# Patient Record
Sex: Male | Born: 1985
Health system: Southern US, Community
[De-identification: ages and names within clinical notes are randomized; demographics above are authoritative.]

## PROBLEM LIST (undated history)

## (undated) DIAGNOSIS — T7840XA Allergy, unspecified, initial encounter: Secondary | ICD-10-CM

## (undated) DIAGNOSIS — R51 Headache: Secondary | ICD-10-CM

## (undated) DIAGNOSIS — R519 Headache, unspecified: Secondary | ICD-10-CM

## (undated) HISTORY — DX: Headache, unspecified: R51.9

## (undated) HISTORY — PX: SPINE SURGERY: SHX786

## (undated) HISTORY — PX: INGUINAL HERNIA REPAIR: SUR1180

## (undated) HISTORY — DX: Allergy, unspecified, initial encounter: T78.40XA

## (undated) HISTORY — PX: WISDOM TOOTH EXTRACTION: SHX21

## (undated) HISTORY — DX: Headache: R51

---

## 2012-02-17 DIAGNOSIS — H18609 Keratoconus, unspecified, unspecified eye: Secondary | ICD-10-CM | POA: Insufficient documentation

## 2016-01-01 DIAGNOSIS — L608 Other nail disorders: Secondary | ICD-10-CM | POA: Insufficient documentation

## 2016-11-07 ENCOUNTER — Telehealth: Payer: Self-pay | Admitting: Internal Medicine

## 2016-11-07 NOTE — Telephone Encounter (Signed)
Yes, I will see him.

## 2016-11-07 NOTE — Telephone Encounter (Signed)
Patient states his doctor is closing his practice. He needs a new PCP. A family member Noah Jones told him about you. He has not to anywhere in the [facility].   Do you accepted him as a patient?

## 2016-11-11 NOTE — Telephone Encounter (Signed)
LVM to inform patient to call office to set up new patient appointment.

## 2016-11-13 NOTE — Telephone Encounter (Signed)
Patient has an appointment on November 20, 2016 to est care.

## 2016-11-20 ENCOUNTER — Ambulatory Visit (INDEPENDENT_AMBULATORY_CARE_PROVIDER_SITE_OTHER): Payer: BLUE CROSS/BLUE SHIELD | Admitting: Internal Medicine

## 2016-11-20 ENCOUNTER — Other Ambulatory Visit (INDEPENDENT_AMBULATORY_CARE_PROVIDER_SITE_OTHER): Payer: BLUE CROSS/BLUE SHIELD

## 2016-11-20 ENCOUNTER — Encounter: Payer: Self-pay | Admitting: Internal Medicine

## 2016-11-20 ENCOUNTER — Ambulatory Visit (INDEPENDENT_AMBULATORY_CARE_PROVIDER_SITE_OTHER)
Admission: RE | Admit: 2016-11-20 | Discharge: 2016-11-20 | Disposition: A | Discharge status: Home / Self Care | Payer: BLUE CROSS/BLUE SHIELD | Source: Ambulatory Visit | Attending: Internal Medicine | Admitting: Internal Medicine

## 2016-11-20 VITALS — BP 140/84 | HR 72 | Temp 98.2°F | Resp 16 | Ht 69.45 in | Wt 189.2 lb

## 2016-11-20 DIAGNOSIS — Z0000 Encounter for general adult medical examination without abnormal findings: Secondary | ICD-10-CM

## 2016-11-20 DIAGNOSIS — R519 Headache, unspecified: Secondary | ICD-10-CM

## 2016-11-20 DIAGNOSIS — J0110 Acute frontal sinusitis, unspecified: Secondary | ICD-10-CM | POA: Diagnosis not present

## 2016-11-20 DIAGNOSIS — J301 Allergic rhinitis due to pollen: Secondary | ICD-10-CM

## 2016-11-20 DIAGNOSIS — R51 Headache: Secondary | ICD-10-CM

## 2016-11-20 LAB — COMPREHENSIVE METABOLIC PANEL
ALBUMIN: 4.8 g/dL (ref 3.5–5.2)
ALT: 29 U/L (ref 0–53)
AST: 15 U/L (ref 0–37)
Alkaline Phosphatase: 96 U/L (ref 39–117)
BUN: 21 mg/dL (ref 6–23)
CO2: 26 mEq/L (ref 19–32)
Calcium: 10 mg/dL (ref 8.4–10.5)
Chloride: 104 mEq/L (ref 96–112)
Creatinine, Ser: 0.89 mg/dL (ref 0.40–1.50)
GFR: 105.23 mL/min (ref >60.00)
GLUCOSE: 102 mg/dL — AB (ref 70–99)
POTASSIUM: 4.2 meq/L (ref 3.5–5.1)
Sodium: 138 mEq/L (ref 135–145)
TOTAL PROTEIN: 7.8 g/dL (ref 6.0–8.3)
Total Bilirubin: 0.5 mg/dL (ref 0.2–1.2)

## 2016-11-20 LAB — LIPID PANEL
Cholesterol: 171 mg/dL (ref 0–200)
HDL: 40.6 mg/dL (ref >39.00)
LDL Cholesterol: 99 mg/dL (ref 0–99)
NONHDL: 130.84
Total CHOL/HDL Ratio: 4
Triglycerides: 157 mg/dL — ABNORMAL HIGH (ref 0.0–149.0)
VLDL: 31.4 mg/dL (ref 0.0–40.0)

## 2016-11-20 LAB — CBC WITH DIFFERENTIAL/PLATELET
BASOS ABS: 0.1 10*3/uL (ref 0.0–0.1)
BASOS PCT: 1 % (ref 0.0–3.0)
EOS ABS: 0.1 10*3/uL (ref 0.0–0.7)
Eosinophils Relative: 1.5 % (ref 0.0–5.0)
HEMATOCRIT: 45.6 % (ref 39.0–52.0)
Hemoglobin: 16 g/dL (ref 13.0–17.0)
LYMPHS PCT: 31.1 % (ref 12.0–46.0)
Lymphs Abs: 1.9 10*3/uL (ref 0.7–4.0)
MCHC: 35.2 g/dL (ref 30.0–36.0)
MCV: 89.5 fl (ref 78.0–100.0)
Monocytes Absolute: 0.4 10*3/uL (ref 0.1–1.0)
Monocytes Relative: 7.1 % (ref 3.0–12.0)
NEUTROS ABS: 3.6 10*3/uL (ref 1.4–7.7)
NEUTROS PCT: 59.3 % (ref 43.0–77.0)
PLATELETS: 199 10*3/uL (ref 150.0–400.0)
RBC: 5.1 Mil/uL (ref 4.22–5.81)
RDW: 12.5 % (ref 11.5–15.5)
WBC: 6.1 10*3/uL (ref 4.0–10.5)

## 2016-11-20 LAB — TSH: TSH: 0.92 u[IU]/mL (ref 0.35–4.50)

## 2016-11-20 LAB — SEDIMENTATION RATE: Sed Rate: 10 mm/hr (ref 0–15)

## 2016-11-20 MED ORDER — METHYLPREDNISOLONE 4 MG PO TBPK: ORAL_TABLET | ORAL | 0 refills | Status: DC

## 2016-11-20 MED ORDER — AMOXICILLIN-POT CLAVULANATE 875-125 MG PO TABS: 1.0000 | ORAL_TABLET | Freq: Two times a day (BID) | ORAL | 0 refills | Status: AC

## 2016-11-20 NOTE — Patient Instructions (Signed)

## 2016-11-20 NOTE — Progress Notes (Signed)
Subjective:  Patient ID: Noah Jones, male    DOB: 1986-01-05  Age: 31 y.o. MRN: [id number]  CC: Headache; Annual Exam; and Sinusitis  New to me  HPI Noah Jones presents for a CPX.  He complains that he developed cold symptoms a month ago and has had a persistent cough for 3 weeks that was productive of mucus. He tells me the cough resolved a week ago but now he is dealing with persistent left headache pain above his left eye. He describes it as a sharp, throbbing sensation that is exacerbated by bending forward, sneezing and coughing. He has gotten some symptom relief with Motrin and Allegra. He has had a few episodes of nausea but no vomiting. He has chronic numbness in his right lower extremity from a prior lumbar surgery. He also complains of nasal congestion, sneezing, runny nose, phlegm from his nose and postnasal drip. He denies sore throat, lymphadenopathy, night sweats, fever, or chills. He has chronic rash from eczema and seborrheic dermatitis.  History Noah Jones has a past medical history of Headache.   He has a past surgical history that includes Wisdom tooth extraction.   His family history includes Cancer - Lung in his other; Dementia in his paternal grandmother; Hypercholesterolemia in his paternal uncle; Hypertension in his father and mother; Stroke in his maternal grandfather.He reports that he has never smoked. He has never used smokeless tobacco. He reports that he drinks about 0.6 oz of alcohol per week . He reports that he does not use drugs.  No outpatient prescriptions prior to visit.   No facility-administered medications prior to visit.     ROS Review of Systems  Constitutional: Negative.  Negative for activity change, chills, diaphoresis, fatigue and fever.  HENT: Positive for congestion, postnasal drip, rhinorrhea, sinus pain, sinus pressure and sneezing. Negative for ear discharge, ear pain, facial swelling, sore throat, tinnitus, trouble swallowing and  voice change.   Eyes: Negative.   Respiratory: Negative.  Negative for cough, choking, chest tightness, shortness of breath, wheezing and stridor.   Cardiovascular: Negative for chest pain, palpitations and leg swelling.  Gastrointestinal: Positive for nausea. Negative for abdominal pain, constipation and diarrhea.  Allergic/Immunologic: Negative.   Neurological: Positive for headaches. Negative for dizziness, tremors, seizures, facial asymmetry, weakness, light-headedness and numbness.  Hematological: Negative for adenopathy. Does not bruise/bleed easily.  Psychiatric/Behavioral: Negative.     Objective:  BP 140/84 (BP Location: Left Arm, Patient Position: Sitting, Cuff Size: Normal)   Pulse 72   Temp 98.2 F (36.8 C) (Oral)   Ht 5' 9.45" (1.764 m)   Wt 189 lb 4 oz (85.8 kg)   SpO2 99%   BMI 27.59 kg/m   Physical Exam  Constitutional: He is oriented to person, place, and time.  Non-toxic appearance. He does not have a sickly appearance. He does not appear ill. No distress.  HENT:  Right Ear: Hearing, tympanic membrane, external ear and ear canal normal.  Left Ear: Hearing, tympanic membrane, external ear and ear canal normal.  Nose: Mucosal edema present. No rhinorrhea. No epistaxis. Right sinus exhibits no maxillary sinus tenderness and no frontal sinus tenderness. Left sinus exhibits frontal sinus tenderness. Left sinus exhibits no maxillary sinus tenderness.  Mouth/Throat: Mucous membranes are normal. Mucous membranes are not pale, not dry and not cyanotic. No oral lesions. No trismus in the jaw. No uvula swelling. No oropharyngeal exudate, posterior oropharyngeal erythema or tonsillar abscesses.  Left nasopharynx is swollen with a scant amount of clear exudate  Eyes: Conjunctivae and EOM are normal. Pupils are equal, round, and reactive to light. Right eye exhibits no discharge. Left eye exhibits no discharge. No scleral icterus.  Neck: Normal range of motion. Neck supple. No JVD  present. No thyromegaly present.  Cardiovascular: Normal rate, regular rhythm and intact distal pulses.  Exam reveals no gallop and no friction rub.   No murmur heard. Pulmonary/Chest: Effort normal and breath sounds normal. No respiratory distress. He has no wheezes. He has no rales. He exhibits no tenderness.  Abdominal: Soft. Bowel sounds are normal. He exhibits no mass. There is no tenderness.  Musculoskeletal: Normal range of motion. He exhibits no edema or tenderness.  Lymphadenopathy:    He has no cervical adenopathy.  Neurological: He is alert and oriented to person, place, and time. He has normal reflexes. He displays normal reflexes. No cranial nerve deficit. He exhibits normal muscle tone. Coordination normal.  Skin: Skin is warm and dry. No rash noted. He is not diaphoretic. No erythema. No pallor.  Psychiatric: He has a normal mood and affect. His behavior is normal. Judgment and thought content normal.  Vitals reviewed.     Assessment & Plan:   Noah Jones was seen today for headache, annual exam and sinusitis.  Diagnoses and all orders for this visit:  Routine general medical examination at a health care facility- exam completed, labs ordered, vaccines reviewed, patient education material was given. -     Lipid panel; Future  Acute non-recurrent frontal sinusitis- I've ordered a CT scan of the brain and sinuses to see if there is been extension into the CNS. Will treat empirically with Augmentin. -     CT HEAD WO CONTRAST; Future -     CT MAXILLOFACIAL WO CONTRAST; Future -     amoxicillin-clavulanate (AUGMENTIN) 875-125 MG tablet; Take 1 tablet by mouth 2 (two) times daily.  Intractable episodic headache, unspecified headache type- I will check his labs to see if there is concern for vasculitis, systemic infection, thyroid disease, renal disease. He does have symptoms of a sinus infection so will scan his brain to see if there has been extension into the CNS, he will  continue ibuprofen as needed for symptom relief. -     Comprehensive metabolic panel; Future -     CBC with Differential/Platelet; Future -     Sedimentation rate; Future -     TSH; Future -     CT HEAD WO CONTRAST; Future -     CT MAXILLOFACIAL WO CONTRAST; Future  Seasonal allergic rhinitis due to pollen- he is having a flareup of symptoms despite taking Allegra so will try a course of systemic steroids. -     methylPREDNISolone (MEDROL DOSEPAK) 4 MG TBPK tablet; TAKE AS DIRECTED   I am having Noah Jones start on amoxicillin-clavulanate and methylPREDNISolone. I am also having him maintain his fexofenadine and ibuprofen.  Meds ordered this encounter  Medications  . fexofenadine (ALLEGRA) 30 MG tablet    Sig: Take 30 mg by mouth 2 (two) times daily.  Marland Kitchen. ibuprofen (ADVIL,MOTRIN) 200 MG tablet    Sig: Take 200 mg by mouth 4 (four) times daily.  Marland Kitchen. amoxicillin-clavulanate (AUGMENTIN) 875-125 MG tablet    Sig: Take 1 tablet by mouth 2 (two) times daily.    Dispense:  20 tablet    Refill:  0  . methylPREDNISolone (MEDROL DOSEPAK) 4 MG TBPK tablet    Sig: TAKE AS DIRECTED    Dispense:  21 tablet  Refill:  0     Follow-up: Return in about 1 week (around 11/27/2016).  Sanda Linger, MD

## 2017-07-16 ENCOUNTER — Encounter: Payer: Self-pay | Admitting: Internal Medicine

## 2017-07-16 ENCOUNTER — Ambulatory Visit: Payer: PRIVATE HEALTH INSURANCE | Admitting: Internal Medicine

## 2017-07-16 VITALS — BP 130/78 | HR 95 | Temp 98.3°F | Resp 16 | Ht 69.45 in | Wt 194.5 lb

## 2017-07-16 DIAGNOSIS — F5101 Primary insomnia: Secondary | ICD-10-CM | POA: Diagnosis not present

## 2017-07-16 DIAGNOSIS — K4020 Bilateral inguinal hernia, without obstruction or gangrene, not specified as recurrent: Secondary | ICD-10-CM

## 2017-07-16 MED ORDER — SUVOREXANT 15 MG PO TABS
1.0000 | ORAL_TABLET | Freq: Every evening | ORAL | 5 refills | Status: DC | PRN
Start: 2017-07-16 — End: 2018-12-01

## 2017-07-16 NOTE — Patient Instructions (Signed)

## 2017-07-17 ENCOUNTER — Encounter: Payer: Self-pay | Admitting: Internal Medicine

## 2017-07-17 NOTE — Progress Notes (Signed)
Subjective:  Patient ID: Noah RoachMatthew Jones, male    DOB: 1986-03-07  Age: 32 y.o. MRN: [id number]  CC: Groin Pain   HPI Noah RoachMatthew Rueda presents for concerns about a 3-week history of groin pain.  He describes a groin pain that migrates from the left side to the right side.  The left side occasionally radiates into the scrotum.  He describes the pain as a tenderness.  He has not noticed any bulging.  He also complains that he is started working the third shift.  He tells me this has disturbed his sleep pattern and he has difficulty falling and staying asleep.  Outpatient Medications Prior to Visit  Medication Sig Dispense Refill  . fexofenadine (ALLEGRA) 30 MG tablet Take 30 mg by mouth 2 (two) times daily.    Marland Kitchen. ibuprofen (ADVIL,MOTRIN) 200 MG tablet Take 200 mg by mouth 4 (four) times daily.    . methylPREDNISolone (MEDROL DOSEPAK) 4 MG TBPK tablet TAKE AS DIRECTED 21 tablet 0   No facility-administered medications prior to visit.     ROS Review of Systems  Constitutional: Negative for chills, fatigue and fever.  HENT: Negative.   Eyes: Negative.   Respiratory: Negative for cough, chest tightness, shortness of breath and wheezing.   Cardiovascular: Negative for chest pain, palpitations and leg swelling.  Gastrointestinal: Positive for abdominal pain. Negative for constipation, diarrhea, nausea and vomiting.  Endocrine: Negative.   Genitourinary: Negative.  Negative for decreased urine volume, difficulty urinating, discharge, dysuria, flank pain, frequency, penile pain, penile swelling, scrotal swelling, testicular pain and urgency.  Musculoskeletal: Negative.   Skin: Negative.  Negative for rash.  Neurological: Negative.  Negative for dizziness.  Hematological: Negative for adenopathy. Does not bruise/bleed easily.  Psychiatric/Behavioral: Positive for sleep disturbance. Negative for decreased concentration, dysphoric mood and suicidal ideas. The patient is not nervous/anxious.      Objective:  BP 130/78 (BP Location: Left Arm, Patient Position: Sitting, Cuff Size: Normal)   Pulse 95   Temp 98.3 F (36.8 C) (Oral)   Resp 16   Ht 5' 9.45" (1.764 m)   Wt 194 lb 8 oz (88.2 kg)   SpO2 99%   BMI 28.35 kg/m   BP Readings from Last 3 Encounters:  07/16/17 130/78  11/20/16 140/84    Wt Readings from Last 3 Encounters:  07/16/17 194 lb 8 oz (88.2 kg)  11/20/16 189 lb 4 oz (85.8 kg)    Physical Exam  Constitutional: He is oriented to person, place, and time. No distress.  HENT:  Mouth/Throat: Oropharynx is clear and moist. No oropharyngeal exudate.  Eyes: Conjunctivae are normal. Left eye exhibits no discharge. No scleral icterus.  Neck: Normal range of motion. Neck supple. No JVD present. No thyromegaly present.  Cardiovascular: Normal rate, regular rhythm and normal heart sounds. Exam reveals no gallop.  No murmur heard. Pulmonary/Chest: Effort normal and breath sounds normal. No respiratory distress. He has no wheezes. He has no rales.  Abdominal: Soft. Bowel sounds are normal. He exhibits no distension and no mass. There is no tenderness. A hernia is present. Hernia confirmed positive in the right inguinal area and confirmed positive in the left inguinal area.  Genitourinary: Penis normal. Right testis shows no mass, no swelling and no tenderness. Right testis is descended. Left testis shows no mass, no swelling and no tenderness. Left testis is descended. Circumcised. No penile erythema or penile tenderness. No discharge found.  Genitourinary Comments: There are bilateral, direct inguinal hernias that are only palpated with  Valsalva maneuver.  Musculoskeletal: Normal range of motion. He exhibits no edema or tenderness.  Lymphadenopathy:    He has no cervical adenopathy.       Right: No inguinal adenopathy present.       Left: No inguinal adenopathy present.  Neurological: He is alert and oriented to person, place, and time.  Skin: Skin is warm and dry.  No rash noted. He is not diaphoretic. No erythema.  Psychiatric: He has a normal mood and affect. His behavior is normal. Judgment and thought content normal.  Vitals reviewed.   Lab Results  Component Value Date   WBC 6.1 11/20/2016   HGB 16.0 11/20/2016   HCT 45.6 11/20/2016   PLT 199.0 11/20/2016   GLUCOSE 102 (H) 11/20/2016   CHOL 171 11/20/2016   TRIG 157.0 (H) 11/20/2016   HDL 40.60 11/20/2016   LDLCALC 99 11/20/2016   ALT 29 11/20/2016   AST 15 11/20/2016   NA 138 11/20/2016   K 4.2 11/20/2016   CL 104 11/20/2016   CREATININE 0.89 11/20/2016   BUN 21 11/20/2016   CO2 26 11/20/2016   TSH 0.92 11/20/2016    Ct Head Wo Contrast  Result Date: 11/20/2016 CLINICAL DATA:  Headaches, primarily left temporal region EXAM: CT HEAD WITHOUT CONTRAST TECHNIQUE: Contiguous axial images were obtained from the base of the skull through the vertex without intravenous contrast. COMPARISON:  None. FINDINGS: Brain: The ventricles are normal in size and configuration. There is no intracranial mass, hemorrhage, extra-axial fluid collection, or midline shift. Gray-white compartments appear normal. No evident acute infarct. Vascular: No hyperdense vessel. There is no appreciable vascular calcification. Skull:  The bony calvarium appears intact. Sinuses/Orbits: Visualized paranasal sinuses are clear. Visualized orbits appear symmetric bilaterally. Other:  Visualized mastoid air cells are clear. IMPRESSION: Study within normal limits. Electronically Signed   By: Bretta Bang III M.D.   On: 11/20/2016 11:57   Ct Maxillofacial Ltd Wo Cm  Result Date: 11/20/2016 CLINICAL DATA:  Left temporal headaches with sinus congestion left-sided EXAM: CT PARANASAL SINUS LIMITED WITHOUT CONTRAST TECHNIQUE: Non-contiguous multidetector CT images of the paranasal sinuses were obtained in a coronal plane without contrast. COMPARISON:  Head CT November 20, 2016 FINDINGS: There is a retention cyst in the inferior right  maxillary antrum measuring 1.8 x 1.4 cm. There is mucosal thickening in the inferior left maxillary antrum with apparent retention cyst measuring 1.1 x 0.4 cm. Visualized paranasal sinuses elsewhere are clear. No air-fluid level. No bony destruction or expansion evident. Ostiomeatal complex regions are felt to be patent although less than optimally visualized. There is rightward deviation of the nasal septum. There is narrowing of the right naris due to the nasal septal deviation without frank nares obstruction. Nasal turbinates do not appear appreciably edematous. Surrounding soft tissues appear normal. IMPRESSION: Retention cysts in each inferior maxillary antrum, larger on the right. Mild mucosal thickening inferior left maxillary antrum. Other visualized paranasal sinuses clear. No appreciable ostiomeatal unit complex obstruction. No bony destruction or expansion. No air-fluid levels. Rightward deviation of the nasal septum with narrowing of the right naris. No frank nares obstruction. Electronically Signed   By: Bretta Bang III M.D.   On: 11/20/2016 12:01    Assessment & Plan:   Percival was seen today for groin pain.  Diagnoses and all orders for this visit:  Bilateral inguinal hernia without obstruction or gangrene, recurrence not specified -     Ambulatory referral to General Surgery  Persistent insomnia of non-organic origin -  Suvorexant (BELSOMRA) 15 MG TABS; Take 1 tablet by mouth at bedtime as needed.   I have discontinued Demontay Pember's methylPREDNISolone. I am also having him start on Suvorexant. Additionally, I am having him maintain his fexofenadine and ibuprofen.  Meds ordered this encounter  Medications  . Suvorexant (BELSOMRA) 15 MG TABS    Sig: Take 1 tablet by mouth at bedtime as needed.    Dispense:  30 tablet    Refill:  5     Follow-up: Return if symptoms worsen or fail to improve.  Sanda Linger, MD

## 2017-08-06 ENCOUNTER — Ambulatory Visit: Payer: Self-pay | Admitting: Surgery

## 2017-08-06 NOTE — H&P (Signed)
Noah RoachMatthew Jones Documented: 08/06/2017 4:08 PM Location: [facility] Patient #: [id number] DOB: 03/21/1986 Single / Language: [place] / Race: White Male  History of Present Illness Noah Jones(Noah Bingley C. Fredia Chittenden MD; 08/06/2017 5:26 PM) The patient is a 32 year old male who presents with an inguinal hernia. Note for "Inguinal hernia": ` ` ` Patient sent for surgical consultation at the request of Dr. Sanda Lingerhomas Jones  Chief Complaint: Bilateral inguinal hernias  The patient is a pleasant active male that noted some groin soreness starting about 2 months ago. Initially left-sided. Would radiate testicle. Then right sided. Then sometimes just testicular. It. Was concerned. Saw primary care physician. Concern for bilateral inguinal hernias. Surgical consultation requested. Patient's had no prior surgery. He does not smoke. No problems with bowel movements. Usually 1 or 2 a day. No straining to urinate or urinary infections. He can walk several miles without difficulty. He works graveyard shift doing machine work. Otherwise healthy.  (Review of systems as stated in this history (HPI) or in the review of systems. Otherwise all other 12 point ROS are negative)   Past Surgical History Noah Jones(Noah Jones, CMA; 08/06/2017 4:08 PM) Spinal Surgery - Lower Back  Diagnostic Studies History Noah Jones(Noah Jones, CMA; 08/06/2017 4:08 PM) Colonoscopy never  Allergies Noah Jones(Noah Jones, CMA; 08/06/2017 4:09 PM) No Known Drug Allergies [08/06/2017]:  Medication History (Noah Jones, CMA; 08/06/2017 4:10 PM) Belsomra (15MG  Tablet, Oral) Active. Medications Reconciled  Social History Noah Jones(Noah Jones, CMA; 08/06/2017 4:08 PM) Caffeine use Tea. No drug use Tobacco use Never smoker.  Family History Noah Jones(Noah Jones, CMA; 08/06/2017 4:08 PM) Arthritis Family Members In General. Hypertension Family Members In General, Mother.  Other Problems Noah Jones(Noah Jones, CMA; 08/06/2017 4:08 PM) Anxiety  Disorder Back Pain Hemorrhoids Other disease, cancer, significant illness     Review of Systems (Noah Jones CMA; 08/06/2017 4:08 PM) General Present- Weight Loss. Not Present- Appetite Loss, Chills, Fatigue, Fever, Night Sweats and Weight Gain. Skin Present- Dryness. Not Present- Change in Wart/Mole, Hives, Jaundice, New Lesions, Non-Healing Wounds, Rash and Ulcer. HEENT Present- Seasonal Allergies and Wears glasses/contact lenses. Not Present- Earache, Hearing Loss, Hoarseness, Nose Bleed, Oral Ulcers, Ringing in the Ears, Sinus Pain, Sore Throat, Visual Disturbances and Yellow Eyes. Breast Not Present- Breast Mass, Breast Pain, Nipple Discharge and Skin Changes. Cardiovascular Not Present- Chest Pain, Difficulty Breathing Lying Down, Leg Cramps, Palpitations, Rapid Heart Rate, Shortness of Breath and Swelling of Extremities. Gastrointestinal Present- Abdominal Pain. Not Present- Bloating, Bloody Stool, Change in Bowel Habits, Chronic diarrhea, Constipation, Difficulty Swallowing, Excessive gas, Gets full quickly at meals, Hemorrhoids, Indigestion, Nausea, Rectal Pain and Vomiting. Male Genitourinary Not Present- Blood in Urine, Change in Urinary Stream, Frequency, Impotence, Nocturia, Painful Urination, Urgency and Urine Leakage. Musculoskeletal Present- Joint Stiffness. Not Present- Back Pain, Joint Pain, Muscle Pain, Muscle Weakness and Swelling of Extremities. Neurological Present- Numbness. Not Present- Decreased Memory, Fainting, Headaches, Seizures, Tingling, Tremor, Trouble walking and Weakness. Psychiatric Not Present- Anxiety, Bipolar, Change in Sleep Pattern, Depression, Fearful and Frequent crying. Endocrine Not Present- Cold Intolerance, Excessive Hunger, Hair Changes, Heat Intolerance, Hot flashes and New Diabetes. Hematology Not Present- Blood Thinners, Easy Bruising, Excessive bleeding, Gland problems, HIV and Persistent Infections.  Vitals (Noah Jones CMA; 08/06/2017  4:09 PM) 08/06/2017 4:09 PM Weight: 195 lb Height: 69in Body Surface Area: 2.04 m Body Mass Index: 28.8 kg/m  Pulse: 88 (Regular)  BP: 124/70 (Sitting, Left Arm, Standard)      Physical Exam Noah Jones(Noah Jones C. Ailani Governale MD; 08/06/2017 5:26 PM)  General Mental Status-Alert.  General Appearance-Not in acute distress, Not Sickly. Orientation-Oriented X3. Hydration-Well hydrated. Voice-Normal.  Integumentary Global Assessment Upon inspection and palpation of skin surfaces of the - Axillae: non-tender, no inflammation or ulceration, no drainage. and Distribution of scalp and body hair is normal. General Characteristics Temperature - normal warmth is noted.  Head and Neck Head-normocephalic, atraumatic with no lesions or palpable masses. Face Global Assessment - atraumatic, no absence of expression. Neck Global Assessment - no abnormal movements, no bruit auscultated on the right, no bruit auscultated on the left, no decreased range of motion, non-tender. Trachea-midline. Thyroid Gland Characteristics - non-tender.  Eye Eyeball - Left-Extraocular movements intact, No Nystagmus. Eyeball - Right-Extraocular movements intact, No Nystagmus. Cornea - Left-No Hazy. Cornea - Right-No Hazy. Sclera/Conjunctiva - Left-No scleral icterus, No Discharge. Sclera/Conjunctiva - Right-No scleral icterus, No Discharge. Pupil - Left-Direct reaction to light normal. Pupil - Right-Direct reaction to light normal.  ENMT Ears Pinna - Left - no drainage observed, no generalized tenderness observed. Right - no drainage observed, no generalized tenderness observed. Nose and Sinuses External Inspection of the Nose - no destructive lesion observed. Inspection of the nares - Left - quiet respiration. Right - quiet respiration. Mouth and Throat Lips - Upper Lip - no fissures observed, no pallor noted. Lower Lip - no fissures observed, no pallor noted. Nasopharynx - no  discharge present. Oral Cavity/Oropharynx - Tongue - no dryness observed. Oral Mucosa - no cyanosis observed. Hypopharynx - no evidence of airway distress observed.  Chest and Lung Exam Inspection Movements - Normal and Symmetrical. Accessory muscles - No use of accessory muscles in breathing. Palpation Palpation of the chest reveals - Non-tender. Auscultation Breath sounds - Normal and Clear.  Cardiovascular Auscultation Rhythm - Regular. Murmurs & Other Heart Sounds - Auscultation of the heart reveals - No Murmurs and No Systolic Clicks.  Abdomen Inspection Inspection of the abdomen reveals - No Visible peristalsis and No Abnormal pulsations. Umbilicus - No Bleeding, No Urine drainage. Palpation/Percussion Palpation and Percussion of the abdomen reveal - Soft, Non Tender, No Rebound tenderness, No Rigidity (guarding) and No Cutaneous hyperesthesia. Note: Abdomen soft. Nontender. Not distended. No umbilical or incisional hernias. No guarding.  Male Genitourinary Sexual Maturity Tanner 5 - Adult hair pattern and Adult penile size and shape. Note: Sensitive external rings with impulse on Valsalva consistent with bilateral inguinal hernias. Otherwise normal external male genitalia. Epididymides testes and spermatic cords normal. Sensitive but not severely painful  Peripheral Vascular Upper Extremity Inspection - Left - No Cyanotic nailbeds, Not Ischemic. Right - No Cyanotic nailbeds, Not Ischemic.  Neurologic Neurologic evaluation reveals -normal attention span and ability to concentrate, able to name objects and repeat phrases. Appropriate fund of knowledge , normal sensation and normal coordination. Mental Status Affect - not angry, not paranoid. Cranial Nerves-Normal Bilaterally. Gait-Normal.  Neuropsychiatric Mental status exam performed with findings of-able to articulate well with normal speech/language, rate, volume and coherence, thought content normal  with ability to perform basic computations and apply abstract reasoning and no evidence of hallucinations, delusions, obsessions or homicidal/suicidal ideation.  Musculoskeletal Global Assessment Spine, Ribs and Pelvis - no instability, subluxation or laxity. Right Upper Extremity - no instability, subluxation or laxity.  Lymphatic Head & Neck  General Head & Neck Lymphatics: Bilateral - Description - No Localized lymphadenopathy. Axillary  General Axillary Region: Bilateral - Description - No Localized lymphadenopathy. Femoral & Inguinal  Generalized Femoral & Inguinal Lymphatics: Left - Description - No Localized lymphadenopathy. Right - Description - No Localized lymphadenopathy.  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  I think he would benefit from surgical repair. He is interested in proceeding. We will work towards a convenient time   PREOP - ING HERNIA - ENCOUNTER FOR PREOPERATIVE EXAMINATION FOR GENERAL SURGICAL PROCEDURE (Z01.818)  Current Plans You are being scheduled for surgery- Our schedulers will call you.  You should hear from our office's scheduling department within 5 working days about the location, date, and time of surgery. We try to make accommodations for patient's preferences in scheduling surgery, but sometimes the OR schedule or the surgeon's schedule prevents [place] from making those accommodations.  If you have not heard from our office [phone]) in 5 working days, call the office and ask for your surgeon's nurse.  If you have other questions about your diagnosis, plan, or surgery, call the office and ask for your surgeon's nurse.  Written instructions provided The anatomy & physiology of the abdominal wall and pelvic floor was discussed. The  pathophysiology of hernias in the inguinal and pelvic region was discussed. Natural history risks such as progressive enlargement, pain, incarceration, and strangulation was discussed. Contributors to complications such as smoking, obesity, diabetes, prior surgery, etc were discussed.  I feel the risks of no intervention will lead to serious problems that outweigh the operative risks; therefore, I recommended surgery to reduce and repair the hernia. I explained laparoscopic techniques with possible need for an open approach. I noted usual use of mesh to patch and/or buttress hernia repair  Risks such as bleeding, infection, abscess, need for further treatment, heart attack, death, and other risks were discussed. I noted a good likelihood this will help address the problem. Goals of post-operative recovery were discussed as well. Possibility that this will not correct all symptoms was explained. I stressed the importance of low-impact activity, aggressive pain control, avoiding constipation, & not pushing through pain to minimize risk of post-operative chronic pain or injury. Possibility of reherniation was discussed. We will work to minimize complications.  An educational handout further explaining the pathology & treatment options was given as well. Questions were answered. The patient expresses understanding & wishes to proceed with surgery.  Pt Education - Pamphlet Given - Laparoscopic Hernia Repair: discussed with patient and provided information. Pt Education - [facility] Pain Control (Anitha Kreiser) Pt Education - [facility] Hernia Post-Op HCI (Felicitas Sine): discussed with patient and provided information. Pt Education - [facility] Mesh education: discussed with patient and provided information.  Noah Jones, M.D., F.A.C.S. Gastrointestinal and Minimally Invasive Surgery [facility] [address], [address] [place], [place] [address] [phone] Main / Paging

## 2017-10-13 ENCOUNTER — Ambulatory Visit (INDEPENDENT_AMBULATORY_CARE_PROVIDER_SITE_OTHER): Payer: Managed Care, Other (non HMO)

## 2017-10-13 DIAGNOSIS — Z23 Encounter for immunization: Secondary | ICD-10-CM

## 2017-10-13 DIAGNOSIS — Z299 Encounter for prophylactic measures, unspecified: Secondary | ICD-10-CM

## 2017-11-27 ENCOUNTER — Encounter: Payer: Self-pay | Admitting: Internal Medicine

## 2017-11-27 ENCOUNTER — Ambulatory Visit (INDEPENDENT_AMBULATORY_CARE_PROVIDER_SITE_OTHER): Payer: Managed Care, Other (non HMO) | Admitting: Internal Medicine

## 2017-11-27 ENCOUNTER — Other Ambulatory Visit (INDEPENDENT_AMBULATORY_CARE_PROVIDER_SITE_OTHER): Payer: Managed Care, Other (non HMO)

## 2017-11-27 VITALS — BP 144/102 | HR 76 | Temp 97.8°F | Resp 16 | Ht 69.45 in | Wt 196.0 lb

## 2017-11-27 DIAGNOSIS — I10 Essential (primary) hypertension: Secondary | ICD-10-CM

## 2017-11-27 DIAGNOSIS — Z0000 Encounter for general adult medical examination without abnormal findings: Secondary | ICD-10-CM

## 2017-11-27 LAB — URINALYSIS, ROUTINE W REFLEX MICROSCOPIC
Bilirubin Urine: NEGATIVE
HGB URINE DIPSTICK: NEGATIVE
KETONES UR: NEGATIVE
Leukocytes, UA: NEGATIVE
NITRITE: NEGATIVE
RBC / HPF: NONE SEEN (ref 0–?)
Specific Gravity, Urine: 1.02 (ref 1.000–1.030)
Total Protein, Urine: NEGATIVE
UROBILINOGEN UA: 0.2 (ref 0.0–1.0)
Urine Glucose: NEGATIVE
pH: 6 (ref 5.0–8.0)

## 2017-11-27 LAB — CBC WITH DIFFERENTIAL/PLATELET
BASOS ABS: 0.1 10*3/uL (ref 0.0–0.1)
Basophils Relative: 0.8 % (ref 0.0–3.0)
Eosinophils Absolute: 0.1 10*3/uL (ref 0.0–0.7)
Eosinophils Relative: 2 % (ref 0.0–5.0)
HCT: 47.8 % (ref 39.0–52.0)
HEMOGLOBIN: 16.2 g/dL (ref 13.0–17.0)
LYMPHS ABS: 2 10*3/uL (ref 0.7–4.0)
Lymphocytes Relative: 32.6 % (ref 12.0–46.0)
MCHC: 34 g/dL (ref 30.0–36.0)
MCV: 91 fl (ref 78.0–100.0)
MONO ABS: 0.6 10*3/uL (ref 0.1–1.0)
MONOS PCT: 8.9 % (ref 3.0–12.0)
NEUTROS PCT: 55.7 % (ref 43.0–77.0)
Neutro Abs: 3.4 10*3/uL (ref 1.4–7.7)
Platelets: 206 10*3/uL (ref 150.0–400.0)
RBC: 5.25 Mil/uL (ref 4.22–5.81)
RDW: 12.8 % (ref 11.5–15.5)
WBC: 6.2 10*3/uL (ref 4.0–10.5)

## 2017-11-27 LAB — COMPREHENSIVE METABOLIC PANEL
ALT: 37 U/L (ref 0–53)
AST: 19 U/L (ref 0–37)
Albumin: 4.7 g/dL (ref 3.5–5.2)
Alkaline Phosphatase: 112 U/L (ref 39–117)
BUN: 19 mg/dL (ref 6–23)
CALCIUM: 9.7 mg/dL (ref 8.4–10.5)
CHLORIDE: 103 meq/L (ref 96–112)
CO2: 28 meq/L (ref 19–32)
CREATININE: 0.83 mg/dL (ref 0.40–1.50)
GFR: 113.34 mL/min (ref >60.00)
GLUCOSE: 97 mg/dL (ref 70–99)
Potassium: 4.1 mEq/L (ref 3.5–5.1)
SODIUM: 138 meq/L (ref 135–145)
Total Bilirubin: 0.6 mg/dL (ref 0.2–1.2)
Total Protein: 7.8 g/dL (ref 6.0–8.3)

## 2017-11-27 LAB — LIPID PANEL
CHOL/HDL RATIO: 5
Cholesterol: 189 mg/dL (ref 0–200)
HDL: 38.5 mg/dL — AB (ref >39.00)
LDL Cholesterol: 117 mg/dL — ABNORMAL HIGH (ref 0–99)
NONHDL: 150.69
Triglycerides: 166 mg/dL — ABNORMAL HIGH (ref 0.0–149.0)
VLDL: 33.2 mg/dL (ref 0.0–40.0)

## 2017-11-27 MED ORDER — AZILSARTAN MEDOXOMIL 40 MG PO TABS: 1.0000 | ORAL_TABLET | Freq: Every day | ORAL | 0 refills | Status: DC

## 2017-11-27 NOTE — Progress Notes (Signed)
Subjective:  Patient ID: Noah Jones, male    DOB: June 29, 1985  Age: 32 y.o. MRN: [id number]  CC: Annual Exam and Hypertension   HPI Noah Jones presents for a CPX.  He continues to struggle with insomnia related to working the third shift.  He gets adequate symptom relief with Belsomra.  He denies snoring or sleep apnea.  He has a strong family history of hypertension.  He denies any recent episodes of chest pain, shortness of breath, palpitations, edema, or fatigue.  He occasionally takes ibuprofen for musculoskeletal pain.  Outpatient Medications Prior to Visit  Medication Sig Dispense Refill  . fexofenadine (ALLEGRA) 30 MG tablet Take 30 mg by mouth 2 (two) times daily.    . Suvorexant (BELSOMRA) 15 MG TABS Take 1 tablet by mouth at bedtime as needed. 30 tablet 5  . ibuprofen (ADVIL,MOTRIN) 200 MG tablet Take 200 mg by mouth 4 (four) times daily.     No facility-administered medications prior to visit.     ROS Review of Systems  Constitutional: Negative.  Negative for diaphoresis, fatigue, fever and unexpected weight change.  HENT: Negative.  Negative for trouble swallowing.   Eyes: Negative for visual disturbance.  Respiratory: Negative for apnea, cough, shortness of breath and wheezing.   Cardiovascular: Negative.  Negative for chest pain, palpitations and leg swelling.  Gastrointestinal: Negative.  Negative for abdominal pain, constipation, diarrhea, nausea and vomiting.  Endocrine: Negative.   Genitourinary: Negative.  Negative for difficulty urinating, dysuria, frequency, penile pain, penile swelling, scrotal swelling, testicular pain and urgency.  Musculoskeletal: Negative.  Negative for arthralgias, back pain, myalgias and neck pain.  Skin: Negative.  Negative for color change, pallor and rash.  Allergic/Immunologic: Negative.   Neurological: Negative.  Negative for dizziness, light-headedness and headaches.  Hematological: Negative for adenopathy. Does not  bruise/bleed easily.  Psychiatric/Behavioral: Positive for sleep disturbance. Negative for behavioral problems, confusion, decreased concentration and suicidal ideas. The patient is not nervous/anxious.     Objective:  BP (!) 144/102 (BP Location: Left Arm, Patient Position: Sitting, Cuff Size: Normal) Comment: BP (R) 144/102 (L) 142/100  Pulse 76   Temp 97.8 F (36.6 C) (Oral)   Resp 16   Ht 5' 9.45" (1.764 m)   Wt 196 lb (88.9 kg)   SpO2 98%   BMI 28.57 kg/m   BP Readings from Last 3 Encounters:  11/27/17 (!) 144/102  07/16/17 130/78  11/20/16 140/84    Wt Readings from Last 3 Encounters:  11/27/17 196 lb (88.9 kg)  07/16/17 194 lb 8 oz (88.2 kg)  11/20/16 189 lb 4 oz (85.8 kg)    Physical Exam  Constitutional: He is oriented to person, place, and time. No distress.  HENT:  Mouth/Throat: Oropharynx is clear and moist. No oropharyngeal exudate.  Eyes: Conjunctivae are normal. No scleral icterus.  Neck: Normal range of motion. Neck supple. No JVD present. No thyromegaly present.  Cardiovascular: Normal rate, regular rhythm and normal heart sounds. Exam reveals no friction rub.  No murmur heard. EKG ---  Sinus  Rhythm  WITHIN NORMAL LIMITS   Pulmonary/Chest: Effort normal and breath sounds normal. No respiratory distress. He has no decreased breath sounds. He has no wheezes. He has no rhonchi. He has no rales.  Abdominal: Soft. Normal appearance and bowel sounds are normal. He exhibits no mass. There is no hepatosplenomegaly. There is no tenderness. No hernia. Hernia confirmed negative in the right inguinal area and confirmed negative in the left inguinal area.  Genitourinary: Testes  normal and penis normal. Right testis shows no mass and no tenderness. Left testis shows no mass, no swelling and no tenderness. Circumcised. No penile erythema or penile tenderness. No discharge found.  Musculoskeletal: Normal range of motion. He exhibits no edema, tenderness or deformity.    Lymphadenopathy:    He has no cervical adenopathy. No inguinal adenopathy noted on the left side.  Neurological: He is alert and oriented to person, place, and time.  Skin: Skin is warm and dry. No rash noted. He is not diaphoretic.  Vitals reviewed.   Lab Results  Component Value Date   WBC 6.1 11/20/2016   HGB 16.0 11/20/2016   HCT 45.6 11/20/2016   PLT 199.0 11/20/2016   GLUCOSE 102 (H) 11/20/2016   CHOL 171 11/20/2016   TRIG 157.0 (H) 11/20/2016   HDL 40.60 11/20/2016   LDLCALC 99 11/20/2016   ALT 29 11/20/2016   AST 15 11/20/2016   NA 138 11/20/2016   K 4.2 11/20/2016   CL 104 11/20/2016   CREATININE 0.89 11/20/2016   BUN 21 11/20/2016   CO2 26 11/20/2016   TSH 2.28 11/27/2017    Ct Head Wo Contrast  Result Date: 11/20/2016 CLINICAL DATA:  Headaches, primarily left temporal region EXAM: CT HEAD WITHOUT CONTRAST TECHNIQUE: Contiguous axial images were obtained from the base of the skull through the vertex without intravenous contrast. COMPARISON:  None. FINDINGS: Brain: The ventricles are normal in size and configuration. There is no intracranial mass, hemorrhage, extra-axial fluid collection, or midline shift. Gray-white compartments appear normal. No evident acute infarct. Vascular: No hyperdense vessel. There is no appreciable vascular calcification. Skull:  The bony calvarium appears intact. Sinuses/Orbits: Visualized paranasal sinuses are clear. Visualized orbits appear symmetric bilaterally. Other:  Visualized mastoid air cells are clear. IMPRESSION: Study within normal limits. Electronically Signed   By: Bretta BangWilliam  Woodruff III M.D.   On: 11/20/2016 11:57   Ct Maxillofacial Ltd Wo Cm  Result Date: 11/20/2016 CLINICAL DATA:  Left temporal headaches with sinus congestion left-sided EXAM: CT PARANASAL SINUS LIMITED WITHOUT CONTRAST TECHNIQUE: Non-contiguous multidetector CT images of the paranasal sinuses were obtained in a coronal plane without contrast. COMPARISON:  Head  CT November 20, 2016 FINDINGS: There is a retention cyst in the inferior right maxillary antrum measuring 1.8 x 1.4 cm. There is mucosal thickening in the inferior left maxillary antrum with apparent retention cyst measuring 1.1 x 0.4 cm. Visualized paranasal sinuses elsewhere are clear. No air-fluid level. No bony destruction or expansion evident. Ostiomeatal complex regions are felt to be patent although less than optimally visualized. There is rightward deviation of the nasal septum. There is narrowing of the right naris due to the nasal septal deviation without frank nares obstruction. Nasal turbinates do not appear appreciably edematous. Surrounding soft tissues appear normal. IMPRESSION: Retention cysts in each inferior maxillary antrum, larger on the right. Mild mucosal thickening inferior left maxillary antrum. Other visualized paranasal sinuses clear. No appreciable ostiomeatal unit complex obstruction. No bony destruction or expansion. No air-fluid levels. Rightward deviation of the nasal septum with narrowing of the right naris. No frank nares obstruction. Electronically Signed   By: Bretta BangWilliam  Woodruff III M.D.   On: 11/20/2016 12:01    Assessment & Plan:   Noah HazardMatthew was seen today for annual exam and hypertension.  Diagnoses and all orders for this visit:  Hypertension, unspecified type- He has new onset, stage I hypertension.  I have asked him to stop taking ibuprofen.  His EKG is negative for LVH  or ischemia.  I will check his labs to try to identify secondary causes.  I have also asked him to start taking an ARB for blood pressure control. -     EKG 12-Lead -     CBC with Differential/Platelet; Future -     Thyroid Panel With TSH; Future -     Urinalysis, Routine w reflex microscopic; Future -     Urine drugs of abuse scrn w alc, routine (Ref Lab); Future -     Aldosterone + renin activity w/ ratio; Future -     Comprehensive metabolic panel; Future -     VITAMIN D 25 Hydroxy (Vit-D  Deficiency, Fractures); Future -     Azilsartan Medoxomil (EDARBI) 40 MG TABS; Take 1 tablet by mouth daily.  Routine general medical examination at a health care facility- Exam completed, labs reviewed, vaccines reviewed, patient education material was given. -     Lipid panel; Future   I have discontinued Danyael Edling's ibuprofen. I am also having him start on Azilsartan Medoxomil. Additionally, I am having him maintain his fexofenadine and Suvorexant.  Meds ordered this encounter  Medications  . Azilsartan Medoxomil (EDARBI) 40 MG TABS    Sig: Take 1 tablet by mouth daily.    Dispense:  42 tablet    Refill:  0     Follow-up: Return in about 6 weeks (around 01/08/2018).  Sanda Linger, MD

## 2017-11-27 NOTE — Patient Instructions (Signed)

## 2017-11-28 LAB — VITAMIN D 25 HYDROXY (VIT D DEFICIENCY, FRACTURES): VITD: 21.34 ng/mL — AB (ref 30.00–100.00)

## 2017-11-30 ENCOUNTER — Encounter: Payer: Self-pay | Admitting: Internal Medicine

## 2017-12-01 LAB — ALDOSTERONE + RENIN ACTIVITY W/ RATIO
ALDO / PRA RATIO: 11.9 ratio (ref 0.9–28.9)
Aldosterone: 7 ng/dL
Renin Activity: 0.59 ng/mL/h (ref 0.25–5.82)

## 2017-12-01 LAB — URINE DRUGS OF ABUSE SCREEN W ALC, ROUTINE (REF LAB)
ALCOHOL, ETHYL (U): NEGATIVE
AMPHETAMINES (1000 NG/ML SCRN): NEGATIVE
BARBITURATES: NEGATIVE
BENZODIAZEPINES: NEGATIVE
COCAINE METABOLITES: NEGATIVE
MARIJUANA MET (50 NG/ML SCRN): NEGATIVE
METHADONE: NEGATIVE
METHAQUALONE: NEGATIVE
OPIATES: NEGATIVE
PHENCYCLIDINE: NEGATIVE
PROPOXYPHENE: NEGATIVE

## 2017-12-01 LAB — THYROID PANEL WITH TSH
Free Thyroxine Index: 2.5 (ref 1.4–3.8)
T3 UPTAKE: 29 % (ref 22–35)
T4, Total: 8.7 ug/dL (ref 4.9–10.5)
TSH: 2.28 mIU/L (ref 0.40–4.50)

## 2018-01-08 ENCOUNTER — Encounter: Payer: Self-pay | Admitting: Internal Medicine

## 2018-01-08 ENCOUNTER — Ambulatory Visit: Payer: Managed Care, Other (non HMO) | Admitting: Internal Medicine

## 2018-01-08 DIAGNOSIS — I10 Essential (primary) hypertension: Secondary | ICD-10-CM | POA: Diagnosis not present

## 2018-01-08 MED ORDER — AZILSARTAN MEDOXOMIL 40 MG PO TABS: 1.0000 | ORAL_TABLET | Freq: Every day | ORAL | 1 refills | Status: DC

## 2018-01-08 NOTE — Progress Notes (Signed)
Subjective:  Patient ID: Noah Jones, male    DOB: Jun 11, 1985  Age: 32 y.o. MRN: [id number]  CC: Hypertension   HPI Noah RoachMatthew Jones presents for a BP check - He is doing well on the ARB.  He denies any recent episodes of dizziness or lightheadedness.  He does not monitor his blood pressure at home.  Outpatient Medications Prior to Visit  Medication Sig Dispense Refill  . fexofenadine (ALLEGRA) 30 MG tablet Take 30 mg by mouth 2 (two) times daily.    . Suvorexant (BELSOMRA) 15 MG TABS Take 1 tablet by mouth at bedtime as needed. 30 tablet 5  . Azilsartan Medoxomil (EDARBI) 40 MG TABS Take 1 tablet by mouth daily. 42 tablet 0   No facility-administered medications prior to visit.     ROS Review of Systems  Constitutional: Negative.  Negative for diaphoresis, fatigue and unexpected weight change.  HENT: Negative.   Eyes: Negative for visual disturbance.  Respiratory: Negative for cough, chest tightness, shortness of breath and wheezing.   Cardiovascular: Negative for chest pain, palpitations and leg swelling.  Gastrointestinal: Negative for abdominal pain, diarrhea, nausea and vomiting.  Endocrine: Negative.   Genitourinary: Negative.  Negative for difficulty urinating.  Musculoskeletal: Negative.  Negative for arthralgias and myalgias.  Skin: Negative.   Neurological: Negative for dizziness, weakness and light-headedness.  Hematological: Negative for adenopathy. Does not bruise/bleed easily.  Psychiatric/Behavioral: Negative.     Objective:  BP 132/86 (BP Location: Left Arm, Patient Position: Sitting, Cuff Size: Normal)   Pulse 79   Temp 98.1 F (36.7 C) (Oral)   Resp 16   Ht 5' 9.45" (1.764 m)   Wt 192 lb 8 oz (87.3 kg)   SpO2 99%   BMI 28.06 kg/m   BP Readings from Last 3 Encounters:  01/08/18 132/86  11/27/17 (!) 144/102  07/16/17 130/78    Wt Readings from Last 3 Encounters:  01/08/18 192 lb 8 oz (87.3 kg)  11/27/17 196 lb (88.9 kg)  07/16/17 194 lb 8 oz  (88.2 kg)    Physical Exam  Constitutional: He is oriented to person, place, and time. No distress.  HENT:  Mouth/Throat: Oropharynx is clear and moist. No oropharyngeal exudate.  Eyes: Conjunctivae are normal. No scleral icterus.  Neck: Normal range of motion. Neck supple. No thyromegaly present.  Cardiovascular: Normal rate, regular rhythm and normal heart sounds.  Pulmonary/Chest: Effort normal and breath sounds normal. He has no wheezes. He has no rales.  Abdominal: Soft. Bowel sounds are normal. He exhibits no mass. There is no hepatosplenomegaly. There is no guarding.  Musculoskeletal: Normal range of motion. He exhibits no edema, tenderness or deformity.  Neurological: He is alert and oriented to person, place, and time.  Skin: Skin is warm and dry. No rash noted. He is not diaphoretic.  Vitals reviewed.   Lab Results  Component Value Date   WBC 6.2 11/27/2017   HGB 16.2 11/27/2017   HCT 47.8 11/27/2017   PLT 206.0 11/27/2017   GLUCOSE 97 11/27/2017   CHOL 189 11/27/2017   TRIG 166.0 (H) 11/27/2017   HDL 38.50 (L) 11/27/2017   LDLCALC 117 (H) 11/27/2017   ALT 37 11/27/2017   AST 19 11/27/2017   NA 138 11/27/2017   K 4.1 11/27/2017   CL 103 11/27/2017   CREATININE 0.83 11/27/2017   BUN 19 11/27/2017   CO2 28 11/27/2017   TSH 2.28 11/27/2017    Ct Head Wo Contrast  Result Date: 11/20/2016 CLINICAL DATA:  Headaches, primarily left temporal region EXAM: CT HEAD WITHOUT CONTRAST TECHNIQUE: Contiguous axial images were obtained from the base of the skull through the vertex without intravenous contrast. COMPARISON:  None. FINDINGS: Brain: The ventricles are normal in size and configuration. There is no intracranial mass, hemorrhage, extra-axial fluid collection, or midline shift. Gray-white compartments appear normal. No evident acute infarct. Vascular: No hyperdense vessel. There is no appreciable vascular calcification. Skull:  The bony calvarium appears intact.  Sinuses/Orbits: Visualized paranasal sinuses are clear. Visualized orbits appear symmetric bilaterally. Other:  Visualized mastoid air cells are clear. IMPRESSION: Study within normal limits. Electronically Signed   By: Bretta Bang III M.D.   On: 11/20/2016 11:57   Ct Maxillofacial Ltd Wo Cm  Result Date: 11/20/2016 CLINICAL DATA:  Left temporal headaches with sinus congestion left-sided EXAM: CT PARANASAL SINUS LIMITED WITHOUT CONTRAST TECHNIQUE: Non-contiguous multidetector CT images of the paranasal sinuses were obtained in a coronal plane without contrast. COMPARISON:  Head CT November 20, 2016 FINDINGS: There is a retention cyst in the inferior right maxillary antrum measuring 1.8 x 1.4 cm. There is mucosal thickening in the inferior left maxillary antrum with apparent retention cyst measuring 1.1 x 0.4 cm. Visualized paranasal sinuses elsewhere are clear. No air-fluid level. No bony destruction or expansion evident. Ostiomeatal complex regions are felt to be patent although less than optimally visualized. There is rightward deviation of the nasal septum. There is narrowing of the right naris due to the nasal septal deviation without frank nares obstruction. Nasal turbinates do not appear appreciably edematous. Surrounding soft tissues appear normal. IMPRESSION: Retention cysts in each inferior maxillary antrum, larger on the right. Mild mucosal thickening inferior left maxillary antrum. Other visualized paranasal sinuses clear. No appreciable ostiomeatal unit complex obstruction. No bony destruction or expansion. No air-fluid levels. Rightward deviation of the nasal septum with narrowing of the right naris. No frank nares obstruction. Electronically Signed   By: Bretta Bang III M.D.   On: 11/20/2016 12:01    Assessment & Plan:   Noah Jones was seen today for hypertension.  Diagnoses and all orders for this visit:  Hypertension, unspecified type- His blood pressure is adequately well  controlled and he is tolerating the current dose of the ARB well.  Will continue. -     Azilsartan Medoxomil (EDARBI) 40 MG TABS; Take 1 tablet by mouth daily.   I am having Noah Jones maintain his fexofenadine, Suvorexant, and Azilsartan Medoxomil.  Meds ordered this encounter  Medications  . Azilsartan Medoxomil (EDARBI) 40 MG TABS    Sig: Take 1 tablet by mouth daily.    Dispense:  90 tablet    Refill:  1     Follow-up: Return in about 6 months (around 07/11/2018).  Sanda Linger, MD

## 2018-01-08 NOTE — Patient Instructions (Signed)

## 2018-01-29 ENCOUNTER — Telehealth: Payer: Self-pay | Admitting: Internal Medicine

## 2018-01-29 NOTE — Telephone Encounter (Signed)
Copied from CRM [id number]. Topic: Quick Communication - See Telephone Encounter >> Jan 29, 2018 10:42 AM Trula SladeWalter, Linda F wrote: CRM for notification. See Telephone encounter for: 01/29/18. Patient's insurance company will not cover Azilsartan Medoxomil (EDARBI) 40 MG TABS medication but they will cover Losartin.  Please send a prescription for Losartin to [facility] preferred pharmacy [facility] [address]

## 2018-02-03 ENCOUNTER — Other Ambulatory Visit: Payer: Self-pay | Admitting: Internal Medicine

## 2018-02-03 DIAGNOSIS — I10 Essential (primary) hypertension: Secondary | ICD-10-CM

## 2018-02-03 MED ORDER — LOSARTAN POTASSIUM 100 MG PO TABS: 100.0000 mg | ORAL_TABLET | Freq: Every day | ORAL | 1 refills | Status: DC

## 2018-02-10 ENCOUNTER — Ambulatory Visit: Payer: Self-pay

## 2018-02-10 NOTE — Telephone Encounter (Signed)
Patient called, left VM to return call to the office to speak to a TN for clarification on the BP medication dosage.

## 2018-02-10 NOTE — Telephone Encounter (Signed)
Pt called back wanting his pcp to call and let him know that when he starts taking the losartan 100 mg, it is not going to be strong for him. Since he has been on the edarbi 40 mg tablet. He is asking for a call back tomorrow and can leave a message on his phone.

## 2018-02-11 ENCOUNTER — Telehealth: Payer: Self-pay | Admitting: Internal Medicine

## 2018-02-11 ENCOUNTER — Other Ambulatory Visit: Payer: Self-pay | Admitting: Internal Medicine

## 2018-02-11 ENCOUNTER — Ambulatory Visit: Payer: Self-pay | Admitting: *Deleted

## 2018-02-11 DIAGNOSIS — I10 Essential (primary) hypertension: Secondary | ICD-10-CM

## 2018-02-11 MED ORDER — LOSARTAN POTASSIUM-HCTZ 100-12.5 MG PO TABS: 1.0000 | ORAL_TABLET | Freq: Every day | ORAL | 0 refills | Status: DC

## 2018-02-11 NOTE — Telephone Encounter (Signed)
Change to losartan/hctz RX has been sent

## 2018-02-11 NOTE — Telephone Encounter (Signed)
Please call patient regarding his change in Losartan. He is confused about what he should be taking and what the change would be.  Can you call him when you are free? (see nurse triage note 02/10/18)

## 2018-02-11 NOTE — Telephone Encounter (Signed)
Pt contacted; he stated that he needs to know how much losartan he was to take 50 mg  Or 100 mg? Spoke with Colon Branch and she is not clear on what instructions to give the pt; she says that the Dr Yetta Barre nurse Judeth Cornfield, will call the pt back; the the pt verbalizes understanding; the pt can be contacted at  [phone].  Reason for Disposition . Caller has URGENT medication question about med that PCP prescribed and triager unable to answer question  Answer Assessment - Initial Assessment Questions 1. SYMPTOMS: "Do you have any symptoms?"     no 2. SEVERITY: If symptoms are present, ask "Are they mild, moderate or severe?"     n/a  Protocols used: MEDICATION QUESTION CALL-A-AH

## 2018-02-12 MED ORDER — LOSARTAN POTASSIUM 100 MG PO TABS: 100.0000 mg | ORAL_TABLET | Freq: Every day | ORAL | 0 refills | Status: DC

## 2018-02-12 NOTE — Telephone Encounter (Signed)
Spoke to pt and informed to take the losartan. The previous message that was sent was not accurate per pt. Pt stated that he was afraid that the Losartan 100 was much stronger than the Edarbi 40 mg. Informed pt they were some what similar and to let us know if experiences any issue with the Losartan dosage.   Informed pt that the losartan HCTZ was sent in because a previous message was sent that pt was worried rx would be too weak. Informed pt that he can leave that rx at the pharmacy and pick up if needed.

## 2018-02-12 NOTE — Telephone Encounter (Signed)
Pt calling back

## 2018-02-12 NOTE — Telephone Encounter (Signed)
LVM for pt to call back as soon as possible.   

## 2018-07-01 ENCOUNTER — Ambulatory Visit: Payer: Managed Care, Other (non HMO) | Admitting: Internal Medicine

## 2018-07-01 ENCOUNTER — Encounter: Payer: Self-pay | Admitting: Internal Medicine

## 2018-07-01 VITALS — BP 134/70 | HR 99 | Temp 98.9°F | Resp 16 | Ht 69.45 in | Wt 196.8 lb

## 2018-07-01 DIAGNOSIS — R1031 Right lower quadrant pain: Secondary | ICD-10-CM

## 2018-07-01 NOTE — Patient Instructions (Signed)
A referral was ordered for surgery.  Keep taking the ibuprofen and ice the area.

## 2018-07-01 NOTE — Assessment & Plan Note (Signed)
Right inner thigh pain ? Muscular vs recurrence of hernia It is very difficult to tell by exam  Will continue ibuprofen and ice the area since this helps Will refer to surgery

## 2018-07-01 NOTE — Progress Notes (Signed)
Subjective:    Patient ID: Noah Jones, male    DOB: 05-07-1986, 33 y.o.   MRN: [id number]  HPI The patient is here for an acute visit.   Pain in right groin:  March 2019 he was found to have inguinal henrias b/l and they were surgically repaired.  5 days ago he developed pain in the right groin/inner thigh.  Initially he thought he pulled a muscle.   The pain was intermittent.  He did take some ibuprofen and it seemed to help.  The pain was not radiating into his testicle and he denies any bulge.  It does feel somewhat similar to the way his hernia presented-he did not feel a bulge at that time either.  He does have a physical job and does some lifting.  He is here because he does not know if this is something muscular or a recurrence of his inguinal hernia.  He states the pain started after he was masturbating.  He is also been trying to do more exercises to strengthen his core and states he could have injured it that way as well.  He denies any increased pain with coughing or straining.  He denies any increased pain with leg movement.  Besides ibuprofen he has been icing the area and that seems to help.   Medications and allergies reviewed with patient and updated if appropriate.  Patient Active Problem List   Diagnosis Date Noted  . Hypertension 11/27/2017  . Persistent insomnia of non-organic origin 07/16/2017  . Routine general medical examination at a health care facility 11/20/2016  . Seasonal allergic rhinitis due to pollen 11/20/2016  . Toenail deformity 01/01/2016  . Keratoconus 02/17/2012    Current Outpatient Medications on File Prior to Visit  Medication Sig Dispense Refill  . fexofenadine (ALLEGRA) 30 MG tablet Take 30 mg by mouth 2 (two) times daily.    Marland Kitchen losartan (COZAAR) 100 MG tablet Take 1 tablet (100 mg total) by mouth daily. 30 tablet 0  . Suvorexant (BELSOMRA) 15 MG TABS Take 1 tablet by mouth at bedtime as needed. 30 tablet 5   No current  facility-administered medications on file prior to visit.     Past Medical History:  Diagnosis Date  . Allergy   . Headache     Past Surgical History:  Procedure Laterality Date  . SPINE SURGERY    . WISDOM TOOTH EXTRACTION      Social History   Socioeconomic History  . Marital status: Single    Spouse name: Not on file  . Number of children: Not on file  . Years of education: Not on file  . Highest education level: Not on file  Occupational History  . Occupation: Chiropodist: [employer]    Comment: Sales executive.  Social Needs  . Financial resource strain: Not on file  . Food insecurity:    Worry: Not on file    Inability: Not on file  . Transportation needs:    Medical: Not on file    Non-medical: Not on file  Tobacco Use  . Smoking status: Never Smoker  . Smokeless tobacco: Never Used  Substance and Sexual Activity  . Alcohol use: Yes    Alcohol/week: 1.0 standard drinks    Types: 1 Cans of beer per week  . Drug use: No  . Sexual activity: Never  Lifestyle  . Physical activity:    Days per week: Not on file    Minutes per session:  Not on file  . Stress: Not on file  Relationships  . Social connections:    Talks on phone: Not on file    Gets together: Not on file    Attends religious service: Not on file    Active member of club or organization: Not on file    Attends meetings of clubs or organizations: Not on file    Relationship status: Not on file  Other Topics Concern  . Not on file  Social History Narrative  . Not on file    Family History  Problem Relation Age of Onset  . Hypertension Mother   . Hypertension Father   . Stroke Maternal Grandfather   . Dementia Paternal Grandmother   . Cancer - Lung Other   . Hypercholesterolemia Paternal Uncle   . Hypertension Brother   . Cancer Neg Hx   . Diabetes Neg Hx   . Early death Neg Hx   . Heart disease Neg Hx   . Alcohol abuse Neg Hx     Review of Systems  Constitutional:  Negative for fever.  Musculoskeletal:       Right inner thigh pain, no bulge  Skin: Negative for color change.  Neurological: Negative for weakness and numbness.       Objective:   Vitals:   07/01/18 1535  BP: 134/70  Pulse: 99  Resp: 16  Temp: 98.9 F (37.2 C)  SpO2: 99%   BP Readings from Last 3 Encounters:  07/01/18 134/70  01/08/18 132/86  11/27/17 (!) 144/102   Wt Readings from Last 3 Encounters:  07/01/18 196 lb 12.8 oz (89.3 kg)  01/08/18 192 lb 8 oz (87.3 kg)  11/27/17 196 lb (88.9 kg)   Body mass index is 28.69 kg/m.   Physical Exam Constitutional:      Appearance: Normal appearance.  HENT:     Head: Normocephalic and atraumatic.  Musculoskeletal:     Comments: Pain with palpation proximal inner right thigh, no bulge here or in suprapubic region, no groin pain with palpation.  Normal range of motion of right leg without increase in pain  Skin:    General: Skin is warm and dry.     Findings: No erythema or rash.  Neurological:     Mental Status: He is alert.            Assessment & Plan:    See Problem List for Assessment and Plan of chronic medical problems.

## 2018-08-04 ENCOUNTER — Other Ambulatory Visit: Payer: Self-pay | Admitting: Internal Medicine

## 2018-08-21 ENCOUNTER — Telehealth: Payer: Self-pay | Admitting: Internal Medicine

## 2018-08-21 NOTE — Telephone Encounter (Signed)
Copied from CRM [id number]. Topic: Quick Communication - See Telephone Encounter >> Aug 21, 2018 12:50 PM Terisa Starr wrote: CRM for notification. See Telephone encounter for: 08/21/18.  Patient would like to know could he have a refill for losartan (COZAAR) 100 MG tablet - he is scheduled to come in next week but if Dr Yetta Barre is willing to just refill this because of COVID 19 and wait to come in for the appt then please let him know. He said he is doing really well on the medication   [id number] [place], [place] - [address] [place] [address] Phone: [phone] Fax: [phone]

## 2018-08-21 NOTE — Telephone Encounter (Signed)
Routing to dr jones, please advise, thanks 

## 2018-08-23 ENCOUNTER — Other Ambulatory Visit: Payer: Self-pay | Admitting: Internal Medicine

## 2018-08-23 DIAGNOSIS — I10 Essential (primary) hypertension: Secondary | ICD-10-CM

## 2018-08-23 MED ORDER — LOSARTAN POTASSIUM 100 MG PO TABS: 100.0000 mg | ORAL_TABLET | Freq: Every day | ORAL | 0 refills | Status: DC

## 2018-08-26 ENCOUNTER — Ambulatory Visit: Payer: Managed Care, Other (non HMO) | Admitting: Internal Medicine

## 2018-11-14 ENCOUNTER — Other Ambulatory Visit: Payer: Self-pay | Admitting: Internal Medicine

## 2018-11-14 DIAGNOSIS — I10 Essential (primary) hypertension: Secondary | ICD-10-CM

## 2018-11-18 ENCOUNTER — Encounter: Payer: Self-pay | Admitting: Family

## 2018-11-18 ENCOUNTER — Other Ambulatory Visit: Payer: Self-pay

## 2018-11-18 ENCOUNTER — Ambulatory Visit: Payer: Self-pay | Admitting: *Deleted

## 2018-11-18 ENCOUNTER — Telehealth: Payer: Self-pay

## 2018-11-18 ENCOUNTER — Ambulatory Visit (INDEPENDENT_AMBULATORY_CARE_PROVIDER_SITE_OTHER): Payer: Managed Care, Other (non HMO) | Admitting: Family

## 2018-11-18 DIAGNOSIS — Z20828 Contact with and (suspected) exposure to other viral communicable diseases: Secondary | ICD-10-CM

## 2018-11-18 DIAGNOSIS — Z20822 Contact with and (suspected) exposure to covid-19: Secondary | ICD-10-CM

## 2018-11-18 NOTE — Progress Notes (Signed)
  Noah Jones is a 33 y.o. male with the following history as recorded in EpicCare:  Patient Active Problem List   Diagnosis Date Noted  . Groin pain, right 07/01/2018  . Hypertension 11/27/2017  . Persistent insomnia of non-organic origin 07/16/2017  . Routine general medical examination at a health care facility 11/20/2016  . Seasonal allergic rhinitis due to pollen 11/20/2016  . Toenail deformity 01/01/2016  . Keratoconus 02/17/2012    Current Outpatient Medications  Medication Sig Dispense Refill  . fexofenadine (ALLEGRA) 30 MG tablet Take 30 mg by mouth 2 (two) times daily.    Marland Kitchen losartan (COZAAR) 100 MG tablet Take 1 tablet (100 mg total) by mouth daily. 90 tablet 0  . Suvorexant (BELSOMRA) 15 MG TABS Take 1 tablet by mouth at bedtime as needed. 30 tablet 5   No current facility-administered medications for this visit.     Allergies: Other  Past Medical History:  Diagnosis Date  . Allergy   . Headache     Past Surgical History:  Procedure Laterality Date  . SPINE SURGERY    . WISDOM TOOTH EXTRACTION      Family History  Problem Relation Age of Onset  . Hypertension Mother   . Hypertension Father   . Stroke Maternal Grandfather   . Dementia Paternal Grandmother   . Cancer - Lung Other   . Hypercholesterolemia Paternal Uncle   . Hypertension Brother   . Cancer Neg Hx   . Diabetes Neg Hx   . Early death Neg Hx   . Heart disease Neg Hx   . Alcohol abuse Neg Hx     Social History   Tobacco Use  . Smoking status: Never Smoker  . Smokeless tobacco: Never Used  Substance Use Topics  . Alcohol use: Yes    Alcohol/week: 1.0 standard drinks    Types: 1 Cans of beer per week    Subjective:   I connected with Jacques Navy on 11/18/18 at  9:20 AM EDT by a video enabled telemedicine application and verified that I am speaking with the correct person using two identifiers. Patient and I are the only 2 people on the phone call.    I discussed the limitations of  evaluation and management by telemedicine and the availability of in person appointments. The patient expressed understanding and agreed to proceed.   Concerned for direct COVID exposure; was notified yesterday that a co-worker tested positive; patient notes he is having mild shortness of breath; denies any fever, cough; + mild headache- thinks allergy related; no change in sense of taste of smell or taste; some body aches but feels related to weight and inability to exercise.    Objective:  There were no vitals filed for this visit.  General: Well developed, well nourished, in no acute distress  Head: Normocephalic and atraumatic  Lungs: Respirations unlabored;  Neurologic: Alert and oriented; speech intact; face symmetrical;   Assessment:  1. Close Exposure to Covid-19 Virus     Plan:  Will order COVID testing per protocol; patient understands to quarantine until results are available; symptomatic treatment discussed; increase fluids, rest; strict ER precautions discussed; follow-up to be determined.   No follow-ups on file.  No orders of the defined types were placed in this encounter.   Requested Prescriptions    No prescriptions requested or ordered in this encounter

## 2018-11-18 NOTE — Telephone Encounter (Signed)
Pt called stating that he has been exposed to several people at work who tested positive for COVID; his last day at work was 11/17/2018; he says he feels winded more easily than normal on 11/17/2018; he would like to be tested for COVID; recommendations made per nurse triage protocol; [facility]  Reason for Disposition . [1] Weak immune system (e.g., HIV positive, leukemia, cancer chemotherapy, transplant patient, splenectomy) AND [2] exposed to chickenpox within last 5 days AND [3] never received chickenpox vaccine or had chickenpox . MILD difficulty breathing (e.g., minimal/no SOB at rest, SOB with walking, pulse <100)  Answer Assessment - Initial Assessment Questions 1. CLOSE CONTACT: "Who is the person with the confirmed or suspected COVID-19 infection that you were exposed to?"     Co workers 2. PLACE of CONTACT: "Where were you when you were exposed to COVID-19?" (e.g., home, school, medical waiting room; which city?)  [place] 3. TYPE of CONTACT: "How much contact was there?" (e.g., sitting next to, live in same house, work in same office, same building)     Same office 4. DURATION of CONTACT: "How long were you in contact with the COVID-19 patient?" (e.g., a few seconds, passed by person, a few minutes, live with the patient)     hours 5. DATE of CONTACT: "When did you have contact with a COVID-19 patient?" (e.g., how many days ago)     Coworkers positive 11/14/2018 6. TRAVEL: "Have you traveled out of the country recently?" If so, "When and where?"     * Also ask about out-of-state travel, since the CDC has identified some high-risk cities for community spread in the [place].     * Note: Travel becomes less relevant if there is widespread community transmission where the patient lives.     no 7. COMMUNITY SPREAD: "Are there lots of cases of COVID-19 (community spread) where you live?" (See public health department website, if unsure)      Major  community spread 8. SYMPTOMS: "Do you have any symptoms?" (e.g., fever, cough, breathing difficulty)     Feels winded more easily than normal 9. PREGNANCY OR POSTPARTUM: "Is there any chance you are pregnant?" "When was your last menstrual period?" "Did you deliver in the last 2 weeks?"     n/a 10. HIGH RISK: "Do you have any heart or lung problems? Do you have a weak immune system?" (e.g., CHF, COPD, asthma, HIV positive, chemotherapy, renal failure, diabetes mellitus, sickle cell anemia)      Hypertension, history of bronchitis  Answer Assessment - Initial Assessment Questions 1. COVID-19 DIAGNOSIS: "Who made your Coronavirus (COVID-19) diagnosis?" "Was it confirmed by a positive lab test?" If not diagnosed by a HCP, ask "Are there lots of cases (community spread) where you live?" (See public health department website, if unsure)     Major community spread 2. ONSET: "When did the COVID-19 symptoms start?"      6/06/08/2018 3. WORST SYMPTOM: "What is your worst symptom?" (e.g., cough, fever, shortness of breath, muscle aches)    More winded than normal 4. COUGH: "Do you have a cough?" If so, ask: "How bad is the cough?"       mo 5. FEVER: "Do you have a fever?" If so, ask: "What is your temperature, how was it measured, and when did it start?"     no 6. RESPIRATORY STATUS: "Describe your breathing?" (e.g., shortness of breath, wheezing, unable to speak)  More winded than normal 7. BETTER-SAME-WORSE: "Are you getting better, staying the same or getting worse compared to yesterday?"  If getting worse, ask, "In what way?"     8. HIGH RISK DISEASE: "Do you have any chronic medical problems?" (e.g., asthma, heart or lung disease, weak immune system, etc.)    History of bronchitis, hyptertension 9. PREGNANCY: "Is there any chance you are pregnant?" "When was your last menstrual period?"    n/a 10. OTHER SYMPTOMS: "Do you have any other symptoms?"  (e.g., chills, fatigue, headache, loss of smell  or taste, muscle pain, sore throat)       no  Protocols used: CHICKENPOX EXPOSURE-A-AH, CORONAVIRUS (COVID-19) DIAGNOSED OR SUSPECTED-A-AH, CORONAVIRUS (COVID-19) EXPOSURE-A-AH

## 2018-11-18 NOTE — Telephone Encounter (Signed)
Patient called and advised of the request for covid testing, he asks to go tomorrow. Appointment scheduled for tomorrow, 11/19/18 at 1500 at [facility], advised of location and to wear a mask for anyone in the vehicle, he verbalized understanding. Order placed.   COVID screening for exposure Received: Today Message Contents  Noah Jones, [facility] Testing Noah Jones  DOB: 27-Jul-1985  MRN# [id number]   Reason: Direct Exposure through employment   Insurance: Christella Scheuermann  ID# [id number]  Group # [id number]   Contact number: [phone]

## 2018-11-19 ENCOUNTER — Other Ambulatory Visit: Payer: Managed Care, Other (non HMO)

## 2018-11-19 DIAGNOSIS — Z20822 Contact with and (suspected) exposure to covid-19: Secondary | ICD-10-CM

## 2018-11-21 LAB — NOVEL CORONAVIRUS, NAA: SARS-CoV-2, NAA: NOT DETECTED

## 2018-11-26 ENCOUNTER — Ambulatory Visit: Payer: Managed Care, Other (non HMO) | Admitting: Internal Medicine

## 2018-12-01 ENCOUNTER — Other Ambulatory Visit (INDEPENDENT_AMBULATORY_CARE_PROVIDER_SITE_OTHER): Payer: Managed Care, Other (non HMO)

## 2018-12-01 ENCOUNTER — Other Ambulatory Visit: Payer: Self-pay

## 2018-12-01 ENCOUNTER — Encounter: Payer: Self-pay | Admitting: Internal Medicine

## 2018-12-01 ENCOUNTER — Ambulatory Visit (INDEPENDENT_AMBULATORY_CARE_PROVIDER_SITE_OTHER): Payer: Managed Care, Other (non HMO) | Admitting: Internal Medicine

## 2018-12-01 VITALS — BP 132/84 | HR 88 | Temp 98.2°F | Resp 16 | Ht 69.45 in | Wt 204.0 lb

## 2018-12-01 DIAGNOSIS — I10 Essential (primary) hypertension: Secondary | ICD-10-CM

## 2018-12-01 DIAGNOSIS — R0609 Other forms of dyspnea: Secondary | ICD-10-CM | POA: Diagnosis not present

## 2018-12-01 DIAGNOSIS — Z0000 Encounter for general adult medical examination without abnormal findings: Secondary | ICD-10-CM

## 2018-12-01 LAB — COMPREHENSIVE METABOLIC PANEL
ALT: 46 U/L (ref 0–53)
AST: 23 U/L (ref 0–37)
Albumin: 4.7 g/dL (ref 3.5–5.2)
Alkaline Phosphatase: 93 U/L (ref 39–117)
BUN: 18 mg/dL (ref 6–23)
CO2: 26 mEq/L (ref 19–32)
Calcium: 9.5 mg/dL (ref 8.4–10.5)
Chloride: 101 mEq/L (ref 96–112)
Creatinine, Ser: 0.82 mg/dL (ref 0.40–1.50)
GFR: 107.48 mL/min (ref >60.00)
Glucose, Bld: 80 mg/dL (ref 70–99)
Potassium: 4.1 mEq/L (ref 3.5–5.1)
Sodium: 136 mEq/L (ref 135–145)
Total Bilirubin: 0.4 mg/dL (ref 0.2–1.2)
Total Protein: 7.9 g/dL (ref 6.0–8.3)

## 2018-12-01 LAB — CBC WITH DIFFERENTIAL/PLATELET
Basophils Absolute: 0.1 10*3/uL (ref 0.0–0.1)
Basophils Relative: 0.8 % (ref 0.0–3.0)
Eosinophils Absolute: 0.1 10*3/uL (ref 0.0–0.7)
Eosinophils Relative: 1.6 % (ref 0.0–5.0)
HCT: 44.4 % (ref 39.0–52.0)
Hemoglobin: 15.4 g/dL (ref 13.0–17.0)
Lymphocytes Relative: 30.4 % (ref 12.0–46.0)
Lymphs Abs: 2.2 10*3/uL (ref 0.7–4.0)
MCHC: 34.6 g/dL (ref 30.0–36.0)
MCV: 90.4 fl (ref 78.0–100.0)
Monocytes Absolute: 0.7 10*3/uL (ref 0.1–1.0)
Monocytes Relative: 9.8 % (ref 3.0–12.0)
Neutro Abs: 4.2 10*3/uL (ref 1.4–7.7)
Neutrophils Relative %: 57.4 % (ref 43.0–77.0)
Platelets: 228 10*3/uL (ref 150.0–400.0)
RBC: 4.91 Mil/uL (ref 4.22–5.81)
RDW: 12.5 % (ref 11.5–15.5)
WBC: 7.3 10*3/uL (ref 4.0–10.5)

## 2018-12-01 LAB — LDL CHOLESTEROL, DIRECT: Direct LDL: 131 mg/dL

## 2018-12-01 LAB — LIPID PANEL
Cholesterol: 200 mg/dL (ref 0–200)
HDL: 40.4 mg/dL (ref >39.00)
NonHDL: 159.27
Total CHOL/HDL Ratio: 5
Triglycerides: 325 mg/dL — ABNORMAL HIGH (ref 0.0–149.0)
VLDL: 65 mg/dL — ABNORMAL HIGH (ref 0.0–40.0)

## 2018-12-01 LAB — TSH: TSH: 1.48 u[IU]/mL (ref 0.35–4.50)

## 2018-12-01 NOTE — Patient Instructions (Signed)

## 2018-12-01 NOTE — Progress Notes (Signed)
Subjective:  Patient ID: Noah Jones, male    DOB: [birth year]  Age: 33 y.o. MRN: [id number]  CC: Hypertension and Annual Exam   HPI Noah RoachMatthew Schweers presents for a CPX.  He complains of weight gain over the last few months and the weight gain has resulted in a sensation of shortness of breath with activity and at rest.  He denies chest pain, diaphoresis, dizziness, lightheadedness, near syncope, fatigue, or edema.  He tells me his blood pressure has been well controlled.  Outpatient Medications Prior to Visit  Medication Sig Dispense Refill  . fexofenadine (ALLEGRA) 30 MG tablet Take 30 mg by mouth 2 (two) times daily.    Marland Kitchen. losartan (COZAAR) 100 MG tablet Take 1 tablet (100 mg total) by mouth daily. 90 tablet 0  . Suvorexant (BELSOMRA) 15 MG TABS Take 1 tablet by mouth at bedtime as needed. 30 tablet 5   No facility-administered medications prior to visit.     ROS Review of Systems  Constitutional: Positive for unexpected weight change. Negative for appetite change, diaphoresis and fatigue.  HENT: Negative.   Eyes: Negative for visual disturbance.  Respiratory: Positive for shortness of breath.   Cardiovascular: Negative for chest pain, palpitations and leg swelling.  Gastrointestinal: Negative for abdominal pain, constipation, diarrhea and nausea.  Endocrine: Negative.   Genitourinary: Negative.  Negative for decreased urine volume, difficulty urinating, discharge, dysuria, hematuria, penile pain, penile swelling, scrotal swelling, testicular pain and urgency.  Musculoskeletal: Negative.   Skin: Negative.  Negative for color change.  Neurological: Negative.  Negative for dizziness, weakness and light-headedness.  Hematological: Negative for adenopathy. Does not bruise/bleed easily.  Psychiatric/Behavioral: Negative.     Objective:  BP 132/84 (BP Location: Left Arm, Patient Position: Sitting, Cuff Size: Large)   Pulse 88   Temp 98.2 F (36.8 C) (Oral)   Resp 16   Ht 5'  9.45" (1.764 m)   Wt 204 lb (92.5 kg)   SpO2 97%   BMI 29.74 kg/m   BP Readings from Last 3 Encounters:  12/01/18 132/84  07/01/18 134/70  01/08/18 132/86    Wt Readings from Last 3 Encounters:  12/01/18 204 lb (92.5 kg)  07/01/18 196 lb 12.8 oz (89.3 kg)  01/08/18 192 lb 8 oz (87.3 kg)    Physical Exam Vitals signs reviewed.  Constitutional:      Appearance: He is obese. He is not ill-appearing or diaphoretic.  HENT:     Nose: Nose normal.     Mouth/Throat:     Mouth: Mucous membranes are moist.     Pharynx: No oropharyngeal exudate.  Eyes:     General: No scleral icterus.    Conjunctiva/sclera: Conjunctivae normal.  Neck:     Musculoskeletal: Normal range of motion. No neck rigidity or muscular tenderness.  Cardiovascular:     Rate and Rhythm: Normal rate and regular rhythm.     Heart sounds: No murmur. No gallop.      Comments: EKG ---  Sinus  Rhythm  WITHIN NORMAL LIMITS  Pulmonary:     Breath sounds: No stridor. No wheezing, rhonchi or rales.  Abdominal:     General: Abdomen is protuberant. Bowel sounds are normal. There is no distension.     Palpations: Abdomen is soft. There is no hepatomegaly or splenomegaly.     Tenderness: There is no abdominal tenderness.     Hernia: No hernia is present. There is no hernia in the left inguinal area or right inguinal area.  Genitourinary:  Pubic Area: No rash.      Penis: Normal. No discharge, swelling or lesions.      Scrotum/Testes: Normal.        Right: Mass, tenderness or swelling not present.        Left: Mass, tenderness or swelling not present.     Epididymis:     Right: Normal. Not inflamed or enlarged. No mass or tenderness.     Left: Not inflamed or enlarged. No mass or tenderness.  Musculoskeletal: Normal range of motion.     Right lower leg: No edema.     Left lower leg: No edema.  Lymphadenopathy:     Cervical: No cervical adenopathy.     Lower Body: No right inguinal adenopathy. No left inguinal  adenopathy.  Skin:    General: Skin is warm and dry.     Coloration: Skin is not pale.  Neurological:     General: No focal deficit present.     Mental Status: He is oriented to person, place, and time. Mental status is at baseline.  Psychiatric:        Mood and Affect: Mood normal.        Behavior: Behavior normal.     Lab Results  Component Value Date   WBC 7.3 12/01/2018   HGB 15.4 12/01/2018   HCT 44.4 12/01/2018   PLT 228.0 12/01/2018   GLUCOSE 80 12/01/2018   CHOL 200 12/01/2018   TRIG 325.0 (H) 12/01/2018   HDL 40.40 12/01/2018   LDLDIRECT 131.0 12/01/2018   LDLCALC 117 (H) 11/27/2017   ALT 46 12/01/2018   AST 23 12/01/2018   NA 136 12/01/2018   K 4.1 12/01/2018   CL 101 12/01/2018   CREATININE 0.82 12/01/2018   BUN 18 12/01/2018   CO2 26 12/01/2018   TSH 1.48 12/01/2018    Ct Head Wo Contrast  Result Date: 11/20/2016 CLINICAL DATA:  Headaches, primarily left temporal region EXAM: CT HEAD WITHOUT CONTRAST TECHNIQUE: Contiguous axial images were obtained from the base of the skull through the vertex without intravenous contrast. COMPARISON:  None. FINDINGS: Brain: The ventricles are normal in size and configuration. There is no intracranial mass, hemorrhage, extra-axial fluid collection, or midline shift. Gray-white compartments appear normal. No evident acute infarct. Vascular: No hyperdense vessel. There is no appreciable vascular calcification. Skull:  The bony calvarium appears intact. Sinuses/Orbits: Visualized paranasal sinuses are clear. Visualized orbits appear symmetric bilaterally. Other:  Visualized mastoid air cells are clear. IMPRESSION: Study within normal limits. Electronically Signed   By: Lowella Grip III M.D.   On: 11/20/2016 11:57   [place] Cm  Result Date: 11/20/2016 CLINICAL DATA:  Left temporal headaches with sinus congestion left-sided EXAM: CT PARANASAL SINUS LIMITED WITHOUT CONTRAST TECHNIQUE: Non-contiguous multidetector  CT images of the paranasal sinuses were obtained in a coronal plane without contrast. COMPARISON:  Head CT November 20, 2016 FINDINGS: There is a retention cyst in the inferior right maxillary antrum measuring 1.8 x 1.4 cm. There is mucosal thickening in the inferior left maxillary antrum with apparent retention cyst measuring 1.1 x 0.4 cm. Visualized paranasal sinuses elsewhere are clear. No air-fluid level. No bony destruction or expansion evident. Ostiomeatal complex regions are felt to be patent although less than optimally visualized. There is rightward deviation of the nasal septum. There is narrowing of the right naris due to the nasal septal deviation without frank nares obstruction. Nasal turbinates do not appear appreciably edematous. Surrounding soft tissues appear normal. IMPRESSION: Retention  cysts in each inferior maxillary antrum, larger on the right. Mild mucosal thickening inferior left maxillary antrum. Other visualized paranasal sinuses clear. No appreciable ostiomeatal unit complex obstruction. No bony destruction or expansion. No air-fluid levels. Rightward deviation of the nasal septum with narrowing of the right naris. No frank nares obstruction. Electronically Signed   By: Bretta BangWilliam  Woodruff III M.D.   On: 11/20/2016 12:01    Assessment & Plan:   Molli HazardMatthew was seen today for hypertension and annual exam.  Diagnoses and all orders for this visit:  Essential hypertension- His blood pressure is not quite adequately well controlled.  He is not willing to add another medication medication but he agrees to improve his lifestyle modifications.  His EKG is negative for ischemia or LVH.  His labs are negative for secondary causes or endorgan damage. -     CBC with Differential/Platelet; Future -     Comprehensive metabolic panel; Future -     TSH; Future -     EKG 12-Lead  Routine general medical examination at a health care facility- Exam completed, labs reviewed, vaccines reviewed, patient  education material was given. -     Lipid panel; Future -     HIV Antibody (routine testing w rflx); Future  DOE (dyspnea on exertion)- He has developed shortness of breath with weight gain.  His EKG is negative for LVH or ischemia.  I do not think this is a sign of cardiopulmonary disease but is caused by weight gain and poor conditioning.  I have asked him to improve his lifestyle modifications.   I have discontinued Vicente MalesMatthew Strothers's Suvorexant. I am also having him maintain his fexofenadine and losartan.  No orders of the defined types were placed in this encounter.    Follow-up: Return in about 6 months (around 06/02/2019).  Sanda Lingerhomas Chuckie Mccathern, MD

## 2018-12-02 ENCOUNTER — Encounter: Payer: Self-pay | Admitting: Internal Medicine

## 2018-12-02 ENCOUNTER — Other Ambulatory Visit: Payer: Self-pay | Admitting: Internal Medicine

## 2018-12-02 DIAGNOSIS — E781 Pure hyperglyceridemia: Secondary | ICD-10-CM

## 2018-12-02 DIAGNOSIS — R0609 Other forms of dyspnea: Secondary | ICD-10-CM | POA: Insufficient documentation

## 2018-12-02 LAB — HIV ANTIBODY (ROUTINE TESTING W REFLEX): HIV 1&2 Ab, 4th Generation: NONREACTIVE

## 2018-12-02 MED ORDER — VASCEPA 1 G PO CAPS: 2.0000 | ORAL_CAPSULE | Freq: Two times a day (BID) | ORAL | 1 refills | Status: DC

## 2018-12-23 ENCOUNTER — Ambulatory Visit (INDEPENDENT_AMBULATORY_CARE_PROVIDER_SITE_OTHER): Payer: Managed Care, Other (non HMO) | Admitting: Internal Medicine

## 2018-12-23 ENCOUNTER — Other Ambulatory Visit: Payer: Self-pay

## 2018-12-23 ENCOUNTER — Encounter: Payer: Self-pay | Admitting: Internal Medicine

## 2018-12-23 ENCOUNTER — Ambulatory Visit: Payer: Self-pay | Admitting: *Deleted

## 2018-12-23 VITALS — BP 128/76 | HR 83 | Temp 98.8°F | Ht 69.0 in | Wt 199.0 lb

## 2018-12-23 DIAGNOSIS — F39 Unspecified mood [affective] disorder: Secondary | ICD-10-CM | POA: Diagnosis not present

## 2018-12-23 DIAGNOSIS — R202 Paresthesia of skin: Secondary | ICD-10-CM | POA: Diagnosis not present

## 2018-12-23 DIAGNOSIS — E785 Hyperlipidemia, unspecified: Secondary | ICD-10-CM

## 2018-12-23 DIAGNOSIS — I10 Essential (primary) hypertension: Secondary | ICD-10-CM | POA: Diagnosis not present

## 2018-12-23 MED ORDER — PREDNISONE 10 MG PO TABS: ORAL_TABLET | ORAL | 0 refills | Status: DC

## 2018-12-23 NOTE — Telephone Encounter (Signed)
Appointment has been made for today 7/22 with Dr.John.

## 2018-12-23 NOTE — Assessment & Plan Note (Signed)
Ill defined, suspect possible bipolar illness vs marked situational anxiety; pt decline referral

## 2018-12-23 NOTE — Progress Notes (Signed)
Subjective:    Patient ID: Noah Jones, male    DOB: 1986/03/27, 33 y.o.   MRN: [id number]  HPI  Here to f/u with c/o numbness and tingling and mild discomfort to the LUE he can feel mostly to the post left upper arm and also the distal arm leading to the 4th and 5th fingers only (rarely the thumb); has been mild intermittent x 2 days, and quite nervous and concerned and says wife urged him to come in and he is convinced he has a circulation blockage in the arm, and is wanting to consider a statin and continue to work on his regular exercise and diet and wt control for the past month since his last exam.  Has some mild discomfort to the left post neck occasionally but not clear if related. 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   Pt denies chest pain, increased sob or doe, wheezing, orthopnea, PND, increased LE swelling, palpitations, dizziness or syncope.   Pt denies polydipsia, polyuria, or low sugar symptoms such as weakness or confusion improved with po intake.  Pt states overall good compliance with meds, trying to follow lower cholesterol, diabetic diet, wt overall stable but little exercise however.  Denies worsening depressive symptoms, suicidal ideation, or panic. Past Medical History:  Diagnosis Date  . Allergy   . Headache    Past Surgical History:  Procedure Laterality Date  . SPINE SURGERY    . WISDOM TOOTH EXTRACTION      reports that he has never smoked. He has never used smokeless tobacco. He reports current alcohol use of about 1.0 standard drinks of alcohol per week. He reports that he does not use drugs. family history includes Cancer - Lung in an other family member; Dementia in his paternal grandmother; Hypercholesterolemia in his paternal uncle; Hypertension in his brother, father, and mother; Stroke in his maternal grandfather.  Allergies  Allergen Reactions  . Other Other (See Comments)    Mayonnaise-Stomach upset; Pickles-Vomiting   Current Outpatient Medications on File Prior to Visit  Medication Sig Dispense Refill  . fexofenadine (ALLEGRA) 30 MG tablet Take 30 mg by mouth 2 (two) times daily.    Vanessa Kick Ethyl (VASCEPA) 1 g CAPS Take 2 capsules (2 g total) by mouth 2 (two) times a day. 360 capsule 1  . losartan (COZAAR) 100 MG tablet Take 1 tablet (100 mg total) by mouth daily. 90 tablet 0   No current facility-administered medications on file prior to visit.    Review of Systems  Constitutional: Negative for other unusual diaphoresis or sweats HENT: Negative for ear discharge or swelling Eyes: Negative for other worsening visual disturbances Respiratory: Negative for stridor or other swelling  Gastrointestinal: Negative for worsening distension or other blood Genitourinary: Negative for retention or other urinary change Musculoskeletal: Negative for other MSK pain or swelling Skin: Negative for color change or other new lesions Neurological: Negative for worsening tremors and other numbness  Psychiatric/Behavioral: Negative for worsening agitation or other fatigue All other system neg per pt    Objective:   Physical Exam BP 128/76   Pulse 83   Temp 98.8 F (37.1 C) (Oral)   Ht 5\' 9"  (1.753 m)   Wt 199 lb (90.3 kg)   SpO2 98%   BMI 29.39 kg/m  VS noted,  Constitutional: Pt appears in NAD HENT: Head:  NCAT.  Right Ear: External ear normal.  Left Ear: External ear normal.  Eyes: . Pupils are equal, round, and reactive to light. Conjunctivae and EOM are normal Nose: without d/c or deformity Neck: Neck supple. Gross normal ROM Cardiovascular: Normal rate and regular rhythm.   Pulmonary/Chest: Effort normal and breath sounds without rales or wheezing.  Abd:  Soft, NT, ND, + BS, no organomegaly Neurological: Pt is alert. At baseline orientation, motor 5/5 intact, sens intact, reflex 1+  symmetric bilat UE's Skin: Skin is warm. No rashes, other new lesions, no LE edema Psychiatric: Pt behavior is normal without agitation  No other exam findings Lab Results  Component Value Date   WBC 7.3 12/01/2018   HGB 15.4 12/01/2018   HCT 44.4 12/01/2018   PLT 228.0 12/01/2018   GLUCOSE 80 12/01/2018   CHOL 200 12/01/2018   TRIG 325.0 (H) 12/01/2018   HDL 40.40 12/01/2018   LDLDIRECT 131.0 12/01/2018   LDLCALC 117 (H) 11/27/2017   ALT 46 12/01/2018   AST 23 12/01/2018   NA 136 12/01/2018   K 4.1 12/01/2018   CL 101 12/01/2018   CREATININE 0.82 12/01/2018   BUN 18 12/01/2018   CO2 26 12/01/2018   TSH 1.48 12/01/2018       Assessment & Plan:

## 2018-12-23 NOTE — Assessment & Plan Note (Signed)
D/w pt, there is no evidence that statin tx at his age leads to lower future CV risk; ok to continue low chol diet and f/u with PCP

## 2018-12-23 NOTE — Assessment & Plan Note (Signed)
stable overall by history and exam, recent data reviewed with pt, and pt to continue medical treatment as before,  to f/u any worsening symptoms or concerns, d/w pt we do not need arterial vascular study for the LUE at this time

## 2018-12-23 NOTE — Telephone Encounter (Signed)
Patient calls reporting numbness/tingling on part of left arm. Started yesterday, comes and goes. Reported he has been swinging a hammer with his left arm on/off over the last 5 days which is something he does not usually do. The sensation has almost completley subsided now. Denies CP/SOB/sweating/weakness/HA/vision changes/dizziness. Marland Kitchen Hx of high cholesterol and does not take medication for it that he was prescribed due to the cost. Family hx high cholesterol.  Requesting an appointment. Attempted to warm transfer to pcp, no answer.  Reason for Disposition . [1] Tingling (e.g., pins and needles) of the face, arm / hand, or leg / foot on one side of the body AND [2] present now  Answer Assessment - Initial Assessment Questions 1. SYMPTOM: "What is the main symptom you are concerned about?" (e.g., weakness, numbness)     Left arm 2. ONSET: "When did this start?" (minutes, hours, days; while sleeping)     24 hours ago 3. LAST NORMAL: "When was the last time you were normal (no symptoms)?"     Yesterday before 2pm 4. PATTERN "Does this come and go, or has it been constant since it started?"  "Is it present now?"     Comes and goes, barely noticeable right now 5. CARDIAC SYMPTOMS: "Have you had any of the following symptoms: chest pain, difficulty breathing, palpitations?"     None 6. NEUROLOGIC SYMPTOMS: "Have you had any of the following symptoms: headache, dizziness, vision loss, double vision, changes in speech, unsteady on your feet?"     non of these 7. OTHER SYMPTOMS: "Do you have any other symptoms?"     no 8. PREGNANCY: "Is there any chance you are pregnant?" "When was your last menstrual period?"     nq  Protocols used: NEUROLOGIC DEFICIT-A-AH

## 2018-12-23 NOTE — Telephone Encounter (Signed)
Noted  

## 2018-12-23 NOTE — Assessment & Plan Note (Signed)
D/w pt, hx suggestive of ulnar neuritis, for predpac asd, consider further evaluation such as NCS/EMG and/or c spine mri

## 2018-12-23 NOTE — Patient Instructions (Signed)
OK to continue the gabapentin and naproxyn at home as you have  Please take all new medication as prescribed - the prednisone  Please return if persistent or worsening over next 2-4 weeks  Please continue all other medications as before, and refills have been done if requested.  Please have the pharmacy call with any other refills you may need.  Please keep your appointments with your specialists as you may have planned

## 2019-02-13 ENCOUNTER — Other Ambulatory Visit: Payer: Self-pay | Admitting: Internal Medicine

## 2019-02-13 DIAGNOSIS — I10 Essential (primary) hypertension: Secondary | ICD-10-CM

## 2019-06-18 ENCOUNTER — Encounter: Payer: Self-pay | Admitting: Family

## 2019-06-18 ENCOUNTER — Other Ambulatory Visit: Payer: Self-pay

## 2019-06-18 ENCOUNTER — Ambulatory Visit (INDEPENDENT_AMBULATORY_CARE_PROVIDER_SITE_OTHER): Payer: Managed Care, Other (non HMO) | Admitting: Family

## 2019-06-18 VITALS — BP 118/78 | HR 70 | Temp 98.1°F | Ht 69.0 in | Wt 193.4 lb

## 2019-06-18 DIAGNOSIS — R1032 Left lower quadrant pain: Secondary | ICD-10-CM | POA: Diagnosis not present

## 2019-06-18 NOTE — Patient Instructions (Signed)
If the symptoms persist for another week, please call your surgeon to schedule a follow-up. We could also consider imaging but that cannot be done on the weekend as you are requesting.

## 2019-06-18 NOTE — Progress Notes (Signed)
Noah Jones is a 34 y.o. male with the following history as recorded in [employer]:  Patient Active Problem List   Diagnosis Date Noted  . Arm paresthesia, left 12/23/2018  . HLD (hyperlipidemia) 12/23/2018  . Mood disorder ([place]) 12/23/2018  . DOE (dyspnea on exertion) 12/02/2018  . Hypertension 11/27/2017  . Persistent insomnia of non-organic origin 07/16/2017  . Routine general medical examination at a health care facility 11/20/2016  . Seasonal allergic rhinitis due to pollen 11/20/2016  . Keratoconus 02/17/2012    Current Outpatient Medications  Medication Sig Dispense Refill  . fexofenadine (ALLEGRA) 30 MG tablet Take 30 mg by mouth 2 (two) times daily.    Marland Kitchen losartan (COZAAR) 100 MG tablet TAKE 1 TABLET BY MOUTH EVERY DAY 90 tablet 1  . Icosapent Ethyl (VASCEPA) 1 g CAPS Take 2 capsules (2 g total) by mouth 2 (two) times a day. (Patient not taking: Reported on 06/18/2019) 360 capsule 1   No current facility-administered medications for this visit.    Allergies: Other  Past Medical History:  Diagnosis Date  . Allergy   . Headache     Past Surgical History:  Procedure Laterality Date  . SPINE SURGERY    . WISDOM TOOTH EXTRACTION      Family History  Problem Relation Age of Onset  . Hypertension Mother   . Hypertension Father   . Stroke Maternal Grandfather   . Dementia Paternal Grandmother   . Cancer - Lung Other   . Hypercholesterolemia Paternal Uncle   . Hypertension Brother   . Cancer Neg Hx   . Diabetes Neg Hx   . Early death Neg Hx   . Heart disease Neg Hx   . Alcohol abuse Neg Hx     Social History   Tobacco Use  . Smoking status: Never Smoker  . Smokeless tobacco: Never Used  Substance Use Topics  . 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  Head: Normocephalic and atraumatic  Lungs: Respirations unlabored;  Musculoskeletal: No deformities; no active joint inflammation; no groin bulge noted today;  Extremities: No edema, cyanosis, clubbing  Vessels: Symmetric bilaterally  Neurologic: Alert and oriented; speech intact; face symmetrical; moves all extremities well; CNII-XII intact without focal deficit   Assessment:  1. Left inguinal pain     Plan:  Patient does not want to do any testing as he is trying not to miss work- he defers imaging; he will continue using his Ibuprofen and ice and see if his symptoms continue to improve; try to avoid heavy lifting at work; if the symptoms recur/ persist, he  will follow up with his surgeon.    This visit occurred during the SARS-CoV-2 public health emergency.  Safety protocols were in place, including screening questions prior to the visit, additional usage of staff PPE, and extensive cleaning of exam room while observing appropriate contact time as indicated for disinfecting solutions.     No follow-ups on file.  No orders of the defined types were placed in this encounter.   Requested Prescriptions    No prescriptions requested or ordered in this encounter

## 2019-08-05 ENCOUNTER — Telehealth: Payer: Self-pay

## 2019-08-05 NOTE — Telephone Encounter (Signed)
Patient calling and spoke with [facility] on  08/04/2019 5:15:44 PM and states he is having numbness in his back and arm. Wants to know what they should be doing. he has a numbness, tingling sensation that comes and goes on his left shoulder blade that radiates to his fingers. Caller stated he still has mobility to his left upper extremity. Caller stated he does not have other symptoms at this time. Symptoms started a few months ago.   Advised to see PCP within 24 hours.

## 2019-08-06 ENCOUNTER — Encounter: Payer: Self-pay | Admitting: Internal Medicine

## 2019-08-06 ENCOUNTER — Ambulatory Visit (INDEPENDENT_AMBULATORY_CARE_PROVIDER_SITE_OTHER): Payer: Managed Care, Other (non HMO)

## 2019-08-06 ENCOUNTER — Other Ambulatory Visit: Payer: Self-pay

## 2019-08-06 ENCOUNTER — Ambulatory Visit: Payer: Managed Care, Other (non HMO) | Admitting: Internal Medicine

## 2019-08-06 VITALS — BP 136/82 | HR 81 | Temp 98.4°F | Resp 16 | Ht 69.0 in | Wt 186.0 lb

## 2019-08-06 DIAGNOSIS — R202 Paresthesia of skin: Secondary | ICD-10-CM

## 2019-08-06 DIAGNOSIS — M542 Cervicalgia: Secondary | ICD-10-CM

## 2019-08-06 DIAGNOSIS — I10 Essential (primary) hypertension: Secondary | ICD-10-CM | POA: Diagnosis not present

## 2019-08-06 DIAGNOSIS — R200 Anesthesia of skin: Secondary | ICD-10-CM | POA: Diagnosis not present

## 2019-08-06 NOTE — Progress Notes (Signed)
Subjective:  Patient ID: Noah Jones, male    DOB: [birth year]  Age: 34 y.o. MRN: [id number]  CC: Hypertension  This visit occurred during the SARS-CoV-2 public health emergency.  Safety protocols were in place, including screening questions prior to the visit, additional usage of staff PPE, and extensive cleaning of exam room while observing appropriate contact time as indicated for disinfecting solutions.    HPI Noah Jones presents for f/up - Noah Jones complains of a 9-month history of stiffness in his neck.  The stiffness does not radiate but Noah Jones does complain of numbness and tingling in his left upper extremity especially the hands.  This is more so on the left than the right and most pronounced in the fingertips.  Noah Jones gets adequate symptom relief with Motrin.  Noah Jones denies any weakness or incoordination in his hands or feet.  Outpatient Medications Prior to Visit  Medication Sig Dispense Refill  . fexofenadine (ALLEGRA) 30 MG tablet Take 30 mg by mouth 2 (two) times daily.    Marland Kitchen losartan (COZAAR) 100 MG tablet TAKE 1 TABLET BY MOUTH EVERY DAY 90 tablet 1  . Icosapent Ethyl (VASCEPA) 1 g CAPS Take 2 capsules (2 g total) by mouth 2 (two) times a day. (Patient not taking: Reported on 06/18/2019) 360 capsule 1   No facility-administered medications prior to visit.    ROS Review of Systems  Constitutional: Negative.  Negative for diaphoresis, fatigue and unexpected weight change.  HENT: Negative.   Eyes: Negative for visual disturbance.  Respiratory: Negative for cough, chest tightness, shortness of breath and wheezing.   Cardiovascular: Negative for chest pain, palpitations and leg swelling.  Gastrointestinal: Negative for abdominal pain, blood in stool, constipation, diarrhea, nausea and vomiting.  Endocrine: Negative.   Genitourinary: Negative.  Negative for difficulty urinating.  Musculoskeletal: Positive for neck pain. Negative for arthralgias, back pain and myalgias.  Skin: Negative.   Negative for color change and pallor.  Neurological: Positive for numbness. Negative for dizziness, weakness and headaches.  Hematological: Negative for adenopathy. Does not bruise/bleed easily.  Psychiatric/Behavioral: Negative.     Objective:  BP 136/82 (BP Location: Left Arm, Patient Position: Sitting, Cuff Size: Normal)   Pulse 81   Temp 98.4 F (36.9 C) (Oral)   Resp 16   Ht 5\' 9"  (1.753 m)   Wt 186 lb (84.4 kg)   SpO2 98%   BMI 27.47 kg/m   BP Readings from Last 3 Encounters:  08/06/19 136/82  06/18/19 118/78  12/23/18 128/76    Wt Readings from Last 3 Encounters:  08/06/19 186 lb (84.4 kg)  06/18/19 193 lb 6.4 oz (87.7 kg)  12/23/18 199 lb (90.3 kg)    Physical Exam Constitutional:      Appearance: Normal appearance.  HENT:     Nose: Nose normal.     Mouth/Throat:     Mouth: Mucous membranes are moist.  Eyes:     General: No scleral icterus.    Conjunctiva/sclera: Conjunctivae normal.  Cardiovascular:     Rate and Rhythm: Normal rate and regular rhythm.     Heart sounds: No murmur.  Pulmonary:     Effort: Pulmonary effort is normal.     Breath sounds: No stridor. No wheezing, rhonchi or rales.  Abdominal:     General: Abdomen is flat.     Palpations: There is no mass.     Tenderness: There is no abdominal tenderness. There is no guarding.  Musculoskeletal:        General:  Normal range of motion.     Cervical back: Normal, normal range of motion and neck supple. No swelling, edema, deformity, signs of trauma, tenderness or bony tenderness. No pain with movement.     Right lower leg: No edema.     Left lower leg: No edema.  Lymphadenopathy:     Cervical: No cervical adenopathy.  Skin:    General: Skin is warm and dry.     Coloration: Skin is not pale.  Neurological:     General: No focal deficit present.     Mental Status: Noah Jones is alert.     Cranial Nerves: Cranial nerves are intact.     Sensory: Sensation is intact.     Motor: Motor function is  intact. No weakness, tremor, atrophy or abnormal muscle tone.     Deep Tendon Reflexes: Reflexes normal.     Reflex Scores:      Tricep reflexes are 1+ on the right side and 1+ on the left side.      Bicep reflexes are 1+ on the right side and 1+ on the left side.      Brachioradialis reflexes are 1+ on the right side and 1+ on the left side.      Patellar reflexes are 2+ on the right side and 2+ on the left side.      Achilles reflexes are 1+ on the right side and 1+ on the left side. Psychiatric:        Mood and Affect: Mood normal.     Lab Results  Component Value Date   WBC 7.3 12/01/2018   HGB 15.4 12/01/2018   HCT 44.4 12/01/2018   PLT 228.0 12/01/2018   GLUCOSE 80 12/01/2018   CHOL 200 12/01/2018   TRIG 325.0 (H) 12/01/2018   HDL 40.40 12/01/2018   LDLDIRECT 131.0 12/01/2018   LDLCALC 117 (H) 11/27/2017   ALT 46 12/01/2018   AST 23 12/01/2018   NA 136 12/01/2018   K 4.1 12/01/2018   CL 101 12/01/2018   CREATININE 0.82 12/01/2018   BUN 18 12/01/2018   CO2 26 12/01/2018   TSH 1.48 12/01/2018    CT HEAD WO CONTRAST  Result Date: 11/20/2016 CLINICAL DATA:  Headaches, primarily left temporal region EXAM: CT HEAD WITHOUT CONTRAST TECHNIQUE: Contiguous axial images were obtained from the base of the skull through the vertex without intravenous contrast. COMPARISON:  None. FINDINGS: Brain: The ventricles are normal in size and configuration. There is no intracranial mass, hemorrhage, extra-axial fluid collection, or midline shift. Gray-white compartments appear normal. No evident acute infarct. Vascular: No hyperdense vessel. There is no appreciable vascular calcification. Skull:  The bony calvarium appears intact. Sinuses/Orbits: Visualized paranasal sinuses are clear. Visualized orbits appear symmetric bilaterally. Other:  Visualized mastoid air cells are clear. IMPRESSION: Study within normal limits. Electronically Signed   By: Bretta Bang III M.D.   On: 11/20/2016 11:57    CT MAXILLOFACIAL LTD WO CM  Result Date: 11/20/2016 CLINICAL DATA:  Left temporal headaches with sinus congestion left-sided EXAM: CT PARANASAL SINUS LIMITED WITHOUT CONTRAST TECHNIQUE: Non-contiguous multidetector CT images of the paranasal sinuses were obtained in a coronal plane without contrast. COMPARISON:  Head CT November 20, 2016 FINDINGS: There is a retention cyst in the inferior right maxillary antrum measuring 1.8 x 1.4 cm. There is mucosal thickening in the inferior left maxillary antrum with apparent retention cyst measuring 1.1 x 0.4 cm. Visualized paranasal sinuses elsewhere are clear. No air-fluid level. No bony destruction or  expansion evident. Ostiomeatal complex regions are felt to be patent although less than optimally visualized. There is rightward deviation of the nasal septum. There is narrowing of the right naris due to the nasal septal deviation without frank nares obstruction. Nasal turbinates do not appear appreciably edematous. Surrounding soft tissues appear normal. IMPRESSION: Retention cysts in each inferior maxillary antrum, larger on the right. Mild mucosal thickening inferior left maxillary antrum. Other visualized paranasal sinuses clear. No appreciable ostiomeatal unit complex obstruction. No bony destruction or expansion. No air-fluid levels. Rightward deviation of the nasal septum with narrowing of the right naris. No frank nares obstruction. Electronically Signed   By: Bretta Bang III M.D.   On: 11/20/2016 12:01   DG Cervical Spine Complete  Result Date: 08/06/2019 CLINICAL DATA:  Neck pain, no known injury. EXAM: CERVICAL SPINE - COMPLETE 4+ VIEW COMPARISON:  None. FINDINGS: Normal alignment. No fracture. Prevertebral soft tissues are normal. Disc spaces maintained. Soft tissue calcifications posteriorly likely reflect changes from remote soft tissue injury. IMPRESSION: No acute bony abnormality. Electronically Signed   By: Charlett Nose M.D.   On: 08/06/2019 16:11      Assessment & Plan:   Noah Jones was seen today for hypertension.  Diagnoses and all orders for this visit:  Numbness and tingling in both hands- Plain films of the neck are unremarkable and his neurologic is unrevealing.  I have asked him to undergo an NCS/EMG to try to identify the cause for his symptoms. -     DG Cervical Spine Complete; Future -     Ambulatory referral to Neurology  Neck pain on left side- Plain films are within normal limits and the neurologic exam is unremarkable.  I have asked him to go undergo an NCS/EMG to see if Noah Jones has a cervical radiculitis that is causing his symptoms. -     DG Cervical Spine Complete; Future  Essential hypertension- His blood pressure is adequately well controlled.   I have discontinued Vicente Males Vascepa. I am also having him maintain his fexofenadine and losartan.  No orders of the defined types were placed in this encounter.    Follow-up: Return in about 6 weeks (around 09/17/2019).  Sanda Linger, MD

## 2019-08-06 NOTE — Telephone Encounter (Signed)
Patient is scheduled for today.  

## 2019-08-06 NOTE — Patient Instructions (Signed)

## 2019-08-11 ENCOUNTER — Other Ambulatory Visit: Payer: Self-pay

## 2019-08-11 DIAGNOSIS — I10 Essential (primary) hypertension: Secondary | ICD-10-CM

## 2019-08-11 MED ORDER — LOSARTAN POTASSIUM 100 MG PO TABS: 100.0000 mg | ORAL_TABLET | Freq: Every day | ORAL | 1 refills | Status: DC

## 2019-08-13 ENCOUNTER — Ambulatory Visit: Payer: Managed Care, Other (non HMO) | Attending: Internal Medicine

## 2019-08-13 DIAGNOSIS — Z23 Encounter for immunization: Secondary | ICD-10-CM

## 2019-08-13 NOTE — Progress Notes (Signed)
   Covid-19 Vaccination Clinic  Name:  Caisen Mangas    MRN: [id number] DOB: 07-24-1985  08/13/2019  Mr. Whitford was observed post Covid-19 immunization for 15 minutes without incident. He was provided with Vaccine Information Sheet and instruction to access the V-Safe system.   Mr. Ansell was instructed to call 911 with any severe reactions post vaccine: Marland Kitchen Difficulty breathing  . Swelling of face and throat  . A fast heartbeat  . A bad rash all over body  . Dizziness and weakness   Immunizations Administered    Name Date Dose VIS Date Route   Pfizer COVID-19 Vaccine 08/13/2019  4:02 PM 0.3 mL 05/14/2019 Intramuscular   Manufacturer: [employer], [employer]   Lot: [id number]   NDC: [address]-0

## 2019-08-20 ENCOUNTER — Encounter: Payer: Self-pay | Admitting: Internal Medicine

## 2019-08-24 ENCOUNTER — Other Ambulatory Visit: Payer: Self-pay

## 2019-08-24 ENCOUNTER — Encounter: Payer: Self-pay | Admitting: Internal Medicine

## 2019-08-24 ENCOUNTER — Ambulatory Visit: Payer: Managed Care, Other (non HMO) | Admitting: Internal Medicine

## 2019-08-24 VITALS — BP 136/86 | HR 85 | Temp 98.1°F | Resp 16 | Ht 69.0 in | Wt 189.0 lb

## 2019-08-24 DIAGNOSIS — I10 Essential (primary) hypertension: Secondary | ICD-10-CM

## 2019-08-24 DIAGNOSIS — E781 Pure hyperglyceridemia: Secondary | ICD-10-CM

## 2019-08-24 DIAGNOSIS — R202 Paresthesia of skin: Secondary | ICD-10-CM

## 2019-08-24 DIAGNOSIS — R200 Anesthesia of skin: Secondary | ICD-10-CM

## 2019-08-24 LAB — BASIC METABOLIC PANEL
BUN: 25 mg/dL — ABNORMAL HIGH (ref 6–23)
CO2: 28 mEq/L (ref 19–32)
Calcium: 9.4 mg/dL (ref 8.4–10.5)
Chloride: 103 mEq/L (ref 96–112)
Creatinine, Ser: 0.85 mg/dL (ref 0.40–1.50)
GFR: 102.67 mL/min (ref >60.00)
Glucose, Bld: 86 mg/dL (ref 70–99)
Potassium: 4.1 mEq/L (ref 3.5–5.1)
Sodium: 136 mEq/L (ref 135–145)

## 2019-08-24 LAB — TRIGLYCERIDES: Triglycerides: 177 mg/dL — ABNORMAL HIGH (ref 0.0–149.0)

## 2019-08-24 NOTE — Patient Instructions (Signed)
High Triglycerides Eating Plan Triglycerides are a type of fat in the blood. High levels of triglycerides can increase your risk of heart disease and stroke. If your triglyceride levels are high, choosing the right foods can help lower your triglycerides and keep your heart healthy. Work with your health care provider or a diet and nutrition specialist (dietitian) to develop an eating plan that is right for you. What are tips for following this plan? General guidelines   Lose weight, if you are overweight. For most people, losing 5-10 lbs (2-5 kg) helps lower triglyceride levels. A weight-loss plan may include. ? 30 minutes of exercise at least 5 days a week. ? Reducing the amount of calories, sugar, and fat you eat.  Eat a wide variety of fresh fruits, vegetables, and whole grains. These foods are high in fiber.  Eat foods that contain healthy fats, such as fatty fish, nuts, seeds, and olive oil.  Avoid foods that are high in added sugar, added salt (sodium), saturated fat, and trans fat.  Avoid low-fiber, refined carbohydrates such as white bread, crackers, noodles, and white rice.  Avoid foods with partially hydrogenated oils (trans fats), such as fried foods or stick margarine.  Limit alcohol intake to no more than 1 drink a day for nonpregnant women and 2 drinks a day for men. One drink equals 12 oz of beer, 5 oz of wine, or 1 oz of hard liquor. Your health care provider may recommend that you drink less depending on your overall health. Reading food labels  Check food labels for the amount of saturated fat. Choose foods with no or very little saturated fat.  Check food labels for the amount of trans fat. Choose foods with no trans fat.  Check food labels for the amount of cholesterol. Choose foods low in cholesterol. Ask your dietitian how much cholesterol you should have each day.  Check food labels for the amount of sodium. Choose foods with less than 140 milligrams (mg) per  serving. Shopping  Buy dairy products labeled as nonfat (skim) or low-fat (1%).  Avoid buying processed or prepackaged foods. These are often high in added sugar, sodium, and fat. Cooking  Choose healthy fats when cooking, such as olive oil or canola oil.  Cook foods using lower fat methods, such as baking, broiling, boiling, or grilling.  Make your own sauces, dressings, and marinades when possible, instead of buying them. Store-bought sauces, dressings, and marinades are often high in sodium and sugar. Meal planning  Eat more home-cooked food and less restaurant, buffet, and fast food.  Eat fatty fish at least 2 times each week. Examples of fatty fish include salmon, trout, mackerel, tuna, and herring.  If you eat whole eggs, do not eat more than 3 egg yolks per week. What foods are recommended? The items listed may not be a complete list. Talk with your dietitian about what dietary choices are best for you. Grains Whole wheat or whole grain breads, crackers, cereals, and pasta. Unsweetened oatmeal. Bulgur. Barley. Quinoa. Brown rice. Whole wheat flour tortillas. Vegetables Fresh or frozen vegetables. Low-sodium canned vegetables. Fruits All fresh, canned (in natural juice), or frozen fruits. Meats and other protein foods Skinless chicken or turkey. Ground chicken or turkey. Lean cuts of pork, trimmed of fat. Fish and seafood, especially salmon, trout, and herring. Egg whites. Dried beans, peas, or lentils. Unsalted nuts or seeds. Unsalted canned beans. Natural peanut or almond butter. Dairy Low-fat dairy products. Skim or low-fat (1%) milk. Reduced fat (  2%) and low-sodium cheese. Low-fat ricotta cheese. Low-fat cottage cheese. Plain, low-fat yogurt. Fats and oils Tub margarine without trans fats. Light or reduced-fat mayonnaise. Light or reduced-fat salad dressings. Avocado. Safflower, olive, sunflower, soybean, and canola oils. What foods are not recommended? The items listed  may not be a complete list. Talk with your dietitian about what dietary choices are best for you. Grains White bread. White (regular) pasta. White rice. Cornbread. Bagels. Pastries. Crackers that contain trans fat. Vegetables Creamed or fried vegetables. Vegetables in a cheese sauce. Fruits Sweetened dried fruit. Canned fruit in syrup. Fruit juice. Meats and other protein foods Fatty cuts of meat. Ribs. Chicken wings. Bacon. Sausage. Bologna. Salami. Chitterlings. Fatback. Hot dogs. Bratwurst. Packaged lunch meats. Dairy Whole or reduced-fat (2%) milk. Half-and-half. Cream cheese. Full-fat or sweetened yogurt. Full-fat cheese. Nondairy creamers. Whipped toppings. Processed cheese or cheese spreads. Cheese curds. Beverages Alcohol. Sweetened drinks, such as soda, lemonade, fruit drinks, or punches. Fats and oils Butter. Stick margarine. Lard. Shortening. Ghee. Bacon fat. Tropical oils, such as coconut, palm kernel, or palm oils. Sweets and desserts Corn syrup. Sugars. Honey. Molasses. Candy. Jam and jelly. Syrup. Sweetened cereals. Cookies. Pies. Cakes. Donuts. Muffins. Ice cream. Condiments Store-bought sauces, dressings, and marinades that are high in sugar, such as ketchup and barbecue sauce. Summary  High levels of triglycerides can increase the risk of heart disease and stroke. Choosing the right foods can help lower your triglycerides.  Eat plenty of fresh fruits, vegetables, and whole grains. Choose low-fat dairy and lean meats. Eat fatty fish at least twice a week.  Avoid processed and prepackaged foods with added sugar, sodium, saturated fat, and trans fat.  If you need suggestions or have questions about what types of food are good for you, talk with your health care provider or a dietitian. This information is not intended to replace advice given to you by your health care provider. Make sure you discuss any questions you have with your health care provider. Document Revised:  05/02/2017 Document Reviewed: 07/23/2016 Elsevier Patient Education  2020 Else[employer] Inc.  

## 2019-08-24 NOTE — Progress Notes (Signed)
Subjective:  Patient ID: Noah Jones, male    DOB: [birth year]  Age: 34 y.o. MRN: [id number]  CC: Hyperlipidemia and Hypertension  This visit occurred during the SARS-CoV-2 public health emergency.  Safety protocols were in place, including screening questions prior to the visit, additional usage of staff PPE, and extensive cleaning of exam room while observing appropriate contact time as indicated for disinfecting solutions.   HPI Noah Jones presents for f/up - He tells me the discomfort in his neck has imrpoved and the numbness, weakness, and tingling in his left upper extremity is getting better.  He tells me he is scheduled for a nerve conduction study in the next few weeks.  He continues to take ibuprofen as needed for any discomfort he may experience.  He also tells me his blood pressure has been well controlled.  He has been working on his lifestyle modifications to lower his triglycerides.  Outpatient Medications Prior to Visit  Medication Sig Dispense Refill  . fexofenadine (ALLEGRA) 30 MG tablet Take 30 mg by mouth 2 (two) times daily.    Marland Kitchen losartan (COZAAR) 100 MG tablet Take 1 tablet (100 mg total) by mouth daily. 90 tablet 1   No facility-administered medications prior to visit.    ROS Review of Systems  Constitutional: Negative for appetite change, diaphoresis, fatigue and unexpected weight change.  HENT: Negative.   Eyes: Negative.   Respiratory: Negative for cough, chest tightness, shortness of breath and wheezing.   Gastrointestinal: Negative for abdominal pain, constipation, diarrhea, nausea and vomiting.  Endocrine: Negative.   Genitourinary: Negative.  Negative for difficulty urinating.  Musculoskeletal: Positive for neck pain. Negative for back pain.  Skin: Negative.  Negative for color change and pallor.  Neurological: Positive for weakness and numbness. Negative for dizziness.  Hematological: Negative for adenopathy. Does not bruise/bleed easily.    Psychiatric/Behavioral: Negative.     Objective:  BP 136/86 (BP Location: Left Arm, Patient Position: Sitting, Cuff Size: Large)   Pulse 85   Temp 98.1 F (36.7 C) (Oral)   Resp 16   Ht 5\' 9"  (1.753 m)   Wt 189 lb (85.7 kg)   SpO2 98%   BMI 27.91 kg/m   BP Readings from Last 3 Encounters:  08/24/19 136/86  08/06/19 136/82  06/18/19 118/78    Wt Readings from Last 3 Encounters:  08/24/19 189 lb (85.7 kg)  08/06/19 186 lb (84.4 kg)  06/18/19 193 lb 6.4 oz (87.7 kg)    Physical Exam Vitals reviewed.  Constitutional:      Appearance: Normal appearance.  HENT:     Nose: Nose normal.     Mouth/Throat:     Mouth: Mucous membranes are moist.  Eyes:     General: No scleral icterus.    Conjunctiva/sclera: Conjunctivae normal.  Cardiovascular:     Rate and Rhythm: Normal rate and regular rhythm.     Heart sounds: No murmur.  Pulmonary:     Effort: Pulmonary effort is normal.     Breath sounds: No stridor. No wheezing, rhonchi or rales.  Abdominal:     General: Abdomen is flat.     Palpations: There is no mass.     Tenderness: There is no abdominal tenderness. There is no guarding.  Musculoskeletal:        General: Normal range of motion.     Cervical back: Normal, normal range of motion and neck supple. No swelling, edema or tenderness. Normal range of motion.     Thoracic  back: Normal.     Lumbar back: Normal.     Right lower leg: No edema.     Left lower leg: No edema.  Lymphadenopathy:     Cervical: No cervical adenopathy.  Skin:    General: Skin is warm and dry.     Coloration: Skin is not pale.  Neurological:     General: No focal deficit present.     Mental Status: He is alert.  Psychiatric:        Mood and Affect: Mood normal.        Behavior: Behavior normal.     Lab Results  Component Value Date   WBC 7.3 12/01/2018   HGB 15.4 12/01/2018   HCT 44.4 12/01/2018   PLT 228.0 12/01/2018   GLUCOSE 86 08/24/2019   CHOL 200 12/01/2018   TRIG 177.0  (H) 08/24/2019   HDL 40.40 12/01/2018   LDLDIRECT 131.0 12/01/2018   LDLCALC 117 (H) 11/27/2017   ALT 46 12/01/2018   AST 23 12/01/2018   NA 136 08/24/2019   K 4.1 08/24/2019   CL 103 08/24/2019   CREATININE 0.85 08/24/2019   BUN 25 (H) 08/24/2019   CO2 28 08/24/2019   TSH 1.48 12/01/2018    CT HEAD WO CONTRAST  Result Date: 11/20/2016 CLINICAL DATA:  Headaches, primarily left temporal region EXAM: CT HEAD WITHOUT CONTRAST TECHNIQUE: Contiguous axial images were obtained from the base of the skull through the vertex without intravenous contrast. COMPARISON:  None. FINDINGS: Brain: The ventricles are normal in size and configuration. There is no intracranial mass, hemorrhage, extra-axial fluid collection, or midline shift. Gray-white compartments appear normal. No evident acute infarct. Vascular: No hyperdense vessel. There is no appreciable vascular calcification. Skull:  The bony calvarium appears intact. Sinuses/Orbits: Visualized paranasal sinuses are clear. Visualized orbits appear symmetric bilaterally. Other:  Visualized mastoid air cells are clear. IMPRESSION: Study within normal limits. Electronically Signed   By: Bretta Bang III M.D.   On: 11/20/2016 11:57   CT MAXILLOFACIAL LTD WO CM  Result Date: 11/20/2016 CLINICAL DATA:  Left temporal headaches with sinus congestion left-sided EXAM: CT PARANASAL SINUS LIMITED WITHOUT CONTRAST TECHNIQUE: Non-contiguous multidetector CT images of the paranasal sinuses were obtained in a coronal plane without contrast. COMPARISON:  Head CT November 20, 2016 FINDINGS: There is a retention cyst in the inferior right maxillary antrum measuring 1.8 x 1.4 cm. There is mucosal thickening in the inferior left maxillary antrum with apparent retention cyst measuring 1.1 x 0.4 cm. Visualized paranasal sinuses elsewhere are clear. No air-fluid level. No bony destruction or expansion evident. Ostiomeatal complex regions are felt to be patent although less than  optimally visualized. There is rightward deviation of the nasal septum. There is narrowing of the right naris due to the nasal septal deviation without frank nares obstruction. Nasal turbinates do not appear appreciably edematous. Surrounding soft tissues appear normal. IMPRESSION: Retention cysts in each inferior maxillary antrum, larger on the right. Mild mucosal thickening inferior left maxillary antrum. Other visualized paranasal sinuses clear. No appreciable ostiomeatal unit complex obstruction. No bony destruction or expansion. No air-fluid levels. Rightward deviation of the nasal septum with narrowing of the right naris. No frank nares obstruction. Electronically Signed   By: Bretta Bang III M.D.   On: 11/20/2016 12:01    Assessment & Plan:   Ida was seen today for hyperlipidemia and hypertension.  Diagnoses and all orders for this visit:  Hypertriglyceridemia without hypercholesterolemia- Improvement noted. -     Triglycerides;  Future -     Triglycerides  Essential hypertension - His BP is well controlled. Lytes and renal function are normal. -     Basic metabolic panel; Future -     Basic metabolic panel  Arm paresthesia, left- I await the results of NCS/EMG.   I am having Binnie Vonderhaar maintain his fexofenadine and losartan.  No orders of the defined types were placed in this encounter.    Follow-up: Return in about 6 months (around 02/24/2020).  Scarlette Calico, MD

## 2019-08-25 ENCOUNTER — Encounter: Payer: Self-pay | Admitting: Internal Medicine

## 2019-09-06 ENCOUNTER — Ambulatory Visit: Payer: Managed Care, Other (non HMO)

## 2019-09-08 ENCOUNTER — Ambulatory Visit: Payer: Managed Care, Other (non HMO) | Attending: Internal Medicine

## 2019-09-08 DIAGNOSIS — Z23 Encounter for immunization: Secondary | ICD-10-CM

## 2019-09-08 NOTE — Progress Notes (Signed)
   Covid-19 Vaccination Clinic  Name:  [name]    MRN: [id number] DOB: 1986/04/18  09/08/2019  [name] was observed post Covid-19 immunization for 15 minutes without incident. He was provided with Vaccine Information Sheet and instruction to access the V-Safe system.   [name] was instructed to call 911 with any severe reactions post vaccine: Marland Kitchen Difficulty breathing  . Swelling of face and throat  . A fast heartbeat  . A bad rash all over body  . Dizziness and weakness   Immunizations Administered    Name Date Dose VIS Date Route   Pfizer COVID-19 Vaccine 09/08/2019  2:37 PM 0.3 mL 05/14/2019 Intramuscular   Manufacturer: [employer], [employer]   Lot: [id number]   NDC: [address]-1

## 2019-09-23 ENCOUNTER — Encounter (INDEPENDENT_AMBULATORY_CARE_PROVIDER_SITE_OTHER): Payer: Managed Care, Other (non HMO) | Admitting: Diagnostic Neuroimaging

## 2019-09-23 ENCOUNTER — Other Ambulatory Visit: Payer: Self-pay

## 2019-09-23 ENCOUNTER — Ambulatory Visit (INDEPENDENT_AMBULATORY_CARE_PROVIDER_SITE_OTHER): Payer: Managed Care, Other (non HMO) | Admitting: Diagnostic Neuroimaging

## 2019-09-23 DIAGNOSIS — R200 Anesthesia of skin: Secondary | ICD-10-CM | POA: Diagnosis not present

## 2019-09-23 DIAGNOSIS — Z0289 Encounter for other administrative examinations: Secondary | ICD-10-CM

## 2019-09-23 NOTE — Procedures (Signed)
[facility]  NCS (NERVE CONDUCTION STUDY) WITH EMG (ELECTROMYOGRAPHY) REPORT   STUDY DATE: 09/23/19 PATIENT NAME: Noah Jones DOB: 1985-11-17 MRN: [id number]  ORDERING CLINICIAN: Alona Bene, MD  TECHNOLOGIST: Sherre Scarlet ELECTROMYOGRAPHER: Earlean Polka. Salinda Snedeker, MD  CLINICAL INFORMATION: 34 year old male with bilateral hand numbness.   FINDINGS: NERVE CONDUCTION STUDY: Bilateral median and ulnar motor responses are normal.  Bilateral median and ulnar sensory responses are normal.  Bilateral ulnar F-wave latencies are normal.   NEEDLE ELECTROMYOGRAPHY:  Needle examination of left upper extremity and left cervical paraspinal muscles is normal.   IMPRESSION:   Normal study.  No electrodiagnostic evidence of large fiber neuropathy this time.    INTERPRETING PHYSICIAN:  Penni Bombard, MD Certified in Neurology, Neurophysiology and Neuroimaging  [facility] [address] [place], [place] [address] [phone]   [facility]    Nerve / Sites Muscle Latency Ref. Amplitude Ref. Rel Amp Segments Distance Velocity Ref. Area    ms ms mV mV %  cm m/s m/s mVms  R Median - APB     Wrist APB 2.9 ?4.4 8.5 ?4.0 100 Wrist - APB 7   32.0     Upper arm APB 6.8  8.1  96 Upper arm - Wrist 23 60 ?49 30.9  L Median - APB     Wrist APB 3.1 ?4.4 9.6 ?4.0 100 Wrist - APB 7   40.0     Upper arm APB 7.0  8.8  91.6 Upper arm - Wrist 22 57 ?49 36.5  R Ulnar - ADM     Wrist ADM 2.4 ?3.3 15.1 ?6.0 100 Wrist - ADM 7   41.0     B.Elbow ADM 5.8  13.1  86.8 B.Elbow - Wrist 21 61 ?49 38.4     A.Elbow ADM 7.4  13.0  99.6 A.Elbow - B.Elbow 10 63 ?49 40.7         A.Elbow - Wrist      L Ulnar - ADM     Wrist ADM 2.5 ?3.3 13.2 ?6.0 100 Wrist - ADM 7   36.9     B.Elbow ADM 5.4  12.3  93.1 B.Elbow - Wrist 20 69 ?49 36.8     A.Elbow ADM 7.0  12.1  97.9 A.Elbow - B.Elbow 10 60 ?49 37.3         A.Elbow - Wrist                 SNC    Nerve / Sites Rec. Site  Peak Lat Ref.  Amp Ref. Segments Distance    ms ms V V  cm  R Median - Orthodromic (Dig II, Mid palm)     Dig II Wrist 2.6 ?3.4 20 ?10 Dig II - Wrist 13  L Median - Orthodromic (Dig II, Mid palm)     Dig II Wrist 2.5 ?3.4 24 ?10 Dig II - Wrist 13  R Ulnar - Orthodromic, (Dig V, Mid palm)     Dig V Wrist 2.4 ?3.1 8 ?5 Dig V - Wrist 11  L Ulnar - Orthodromic, (Dig V, Mid palm)     Dig V Wrist 2.4 ?3.1 9 ?5 Dig V - Wrist 65             F  Wave    Nerve F Lat Ref.   ms ms  R Ulnar - ADM 27.9 ?32.0  L Ulnar - ADM 29.5 ?32.0  EMG Summary Table    Spontaneous MUAP Recruitment  Muscle IA Fib PSW Fasc Other Amp Dur. Poly Pattern  L. Deltoid Normal None None None _______ Normal Normal Normal Normal  L. Biceps brachii Normal None None None _______ Normal Normal Normal Normal  L. Triceps brachii Normal None None None _______ Normal Normal Normal Normal  L. Flexor carpi radialis Normal None None None _______ Normal Normal Normal Normal  L. First dorsal interosseous Normal None None None _______ Normal Normal Normal Normal  L. Cervical paraspinals Normal None None None _______ Normal Normal Normal Normal

## 2019-09-24 ENCOUNTER — Other Ambulatory Visit: Payer: Self-pay | Admitting: Internal Medicine

## 2019-09-24 ENCOUNTER — Encounter: Payer: Self-pay | Admitting: Internal Medicine

## 2019-09-24 DIAGNOSIS — R202 Paresthesia of skin: Secondary | ICD-10-CM

## 2019-09-24 DIAGNOSIS — R200 Anesthesia of skin: Secondary | ICD-10-CM

## 2019-10-26 ENCOUNTER — Encounter: Payer: Self-pay | Admitting: Diagnostic Neuroimaging

## 2019-10-26 ENCOUNTER — Other Ambulatory Visit: Payer: Self-pay

## 2019-10-26 ENCOUNTER — Ambulatory Visit (INDEPENDENT_AMBULATORY_CARE_PROVIDER_SITE_OTHER): Payer: Managed Care, Other (non HMO) | Admitting: Diagnostic Neuroimaging

## 2019-10-26 VITALS — BP 122/76 | HR 79 | Ht 70.5 in | Wt 190.2 lb

## 2019-10-26 DIAGNOSIS — R200 Anesthesia of skin: Secondary | ICD-10-CM

## 2019-10-26 NOTE — Progress Notes (Signed)
[facility]  PATIENT: Noah Jones DOB: 09/13/1985  REFERRING CLINICIAN: Etta Grandchild, MD HISTORY FROM: patient  REASON FOR VISIT: new consult    HISTORICAL  CHIEF COMPLAINT:  Chief Complaint  Patient presents with  . Numbness, tingling of hands    rm 6 New Pt    HISTORY OF PRESENT ILLNESS:   34 year old male here evaluation of numbness and tingling.  Symptoms started July 2020.  Initially he had left hand numbness for 6 weeks.  Symptoms spontaneously improved.  Symptoms returned in November 2020 with left shoulder popping sensation and bilateral hand numbness.  Symptoms worsened in January 2021.  No problems with feet or legs.  No problems with face vision speech or swallowing.    REVIEW OF SYSTEMS: Full 14 system review of systems performed and negative with exception of: As per HPI.  ALLERGIES: Allergies  Allergen Reactions  . Other Other (See Comments)    Mayonnaise-Stomach upset; Pickles-Vomiting    HOME MEDICATIONS: Outpatient Medications Prior to Visit  Medication Sig Dispense Refill  . fexofenadine (ALLEGRA) 30 MG tablet Take 30 mg by mouth 2 (two) times daily.    Marland Kitchen losartan (COZAAR) 100 MG tablet Take 1 tablet (100 mg total) by mouth daily. 90 tablet 1   No facility-administered medications prior to visit.    PAST MEDICAL HISTORY: Past Medical History:  Diagnosis Date  . Allergy   . Headache     PAST SURGICAL HISTORY: Past Surgical History:  Procedure Laterality Date  . INGUINAL HERNIA REPAIR    . SPINE SURGERY    . WISDOM TOOTH EXTRACTION      FAMILY HISTORY: Family History  Problem Relation Age of Onset  . Hypertension Mother   . Hypertension Father   . Stroke Maternal Grandfather   . Dementia Paternal Grandmother   . Cancer - Lung Other   . Hypercholesterolemia Paternal Uncle   . Hypertension Brother   . Cancer Neg Hx   . Diabetes Neg Hx   . Early death Neg Hx   . Heart disease Neg Hx   . Alcohol abuse Neg  Hx     SOCIAL HISTORY: Social History   Socioeconomic History  . Marital status: Single    Spouse name: Not on file  . Number of children: Not on file  . Years of education: Not on file  . Highest education level: Bachelor's degree (e.g., BA, AB, BS)  Occupational History  . Occupation: Chiropodist: OTHER    Comment: Sales executive.  Tobacco Use  . Smoking status: Never Smoker  . Smokeless tobacco: Never Used  Substance and Sexual Activity  . Alcohol use: Yes    Alcohol/week: 1.0 standard drinks    Types: 1 Cans of beer per week  . Drug use: No  . Sexual activity: Never  Other Topics Concern  . Not on file  Social History Narrative  . Not on file   Social Determinants of Health   Financial Resource Strain:   . Difficulty of Paying Living Expenses:   Food Insecurity:   . Worried About Programme researcher, broadcasting/film/video in the Last Year:   . Barista in the Last Year:   Transportation Needs:   . Freight forwarder (Medical):   Marland Kitchen Lack of Transportation (Non-Medical):   Physical Activity:   . Days of Exercise per Week:   . Minutes of Exercise per Session:   Stress:   . Feeling of Stress :  Social Connections:   . Frequency of Communication with Friends and Family:   . Frequency of Social Gatherings with Friends and Family:   . Attends Religious Services:   . Active Member of Clubs or Organizations:   . Attends Archivist Meetings:   Marland Kitchen Marital Status:   Intimate Partner Violence:   . Fear of Current or Ex-Partner:   . Emotionally Abused:   Marland Kitchen Physically Abused:   . Sexually Abused:      PHYSICAL EXAM  GENERAL EXAM/CONSTITUTIONAL: Vitals:  Vitals:   10/26/19 1127  BP: 122/76  Pulse: 79  Weight: 190 lb 3.2 oz (86.3 kg)  Height: 5' 10.5" (1.791 m)     Body mass index is 26.91 kg/m. Wt Readings from Last 3 Encounters:  10/26/19 190 lb 3.2 oz (86.3 kg)  08/24/19 189 lb (85.7 kg)  08/06/19 186 lb (84.4 kg)     Patient is in  no distress; well developed, nourished and groomed; neck is supple  CARDIOVASCULAR:  Examination of carotid arteries is normal; no carotid bruits  Regular rate and rhythm, no murmurs  Examination of peripheral vascular system by observation and palpation is normal  EYES:  Ophthalmoscopic exam of optic discs and posterior segments is normal; no papilledema or hemorrhages  No exam data present  MUSCULOSKELETAL:  Gait, strength, tone, movements noted in Neurologic exam below  NEUROLOGIC: MENTAL STATUS:  No flowsheet data found.  awake, alert, oriented to person, place and time  recent and remote memory intact  normal attention and concentration  language fluent, comprehension intact, naming intact  fund of knowledge appropriate  CRANIAL NERVE:   2nd - no papilledema on fundoscopic exam  2nd, 3rd, 4th, 6th - pupils equal and reactive to light, visual fields full to confrontation, extraocular muscles intact, no nystagmus  5th - facial sensation symmetric  7th - facial strength symmetric  8th - hearing intact  9th - palate elevates symmetrically, uvula midline  11th - shoulder shrug symmetric  12th - tongue protrusion midline  MOTOR:   normal bulk and tone, full strength in the BUE, BLE  SENSORY:   normal and symmetric to light touch, pinprick, temperature, vibration  COORDINATION:   finger-nose-finger, fine finger movements normal  REFLEXES:   deep tendon reflexes present and symmetric  GAIT/STATION:   narrow based gait     DIAGNOSTIC DATA (LABS, IMAGING, TESTING) - I reviewed patient records, labs, notes, testing and imaging myself where available.  Lab Results  Component Value Date   WBC 7.3 12/01/2018   HGB 15.4 12/01/2018   HCT 44.4 12/01/2018   MCV 90.4 12/01/2018   PLT 228.0 12/01/2018      Component Value Date/Time   NA 136 08/24/2019 1618   K 4.1 08/24/2019 1618   CL 103 08/24/2019 1618   CO2 28 08/24/2019 1618   GLUCOSE  86 08/24/2019 1618   BUN 25 (H) 08/24/2019 1618   CREATININE 0.85 08/24/2019 1618   CALCIUM 9.4 08/24/2019 1618   PROT 7.9 12/01/2018 1055   ALBUMIN 4.7 12/01/2018 1055   AST 23 12/01/2018 1055   ALT 46 12/01/2018 1055   ALKPHOS 93 12/01/2018 1055   BILITOT 0.4 12/01/2018 1055   Lab Results  Component Value Date   CHOL 200 12/01/2018   HDL 40.40 12/01/2018   LDLCALC 117 (H) 11/27/2017   LDLDIRECT 131.0 12/01/2018   TRIG 177.0 (H) 08/24/2019   CHOLHDL 5 12/01/2018   No results found for: HGBA1C No results found for: [id number]  Lab Results  Component Value Date   TSH 1.48 12/01/2018     09/23/19 EMG/NCS - normal    ASSESSMENT AND PLAN  34 y.o. year old male here with bilateral hand numbness since July 2020, worsening in January 2021.  EMG was negative for carpal tunnel syndrome full proceed with further work-up, including MRI to rule out demyelinating dz or radiculopathies.   Dx:  1. Bilateral hand numbness     PLAN:  Orders Placed This Encounter  Procedures  . MR BRAIN W WO CONTRAST  . MR CERVICAL SPINE W WO CONTRAST  . Vitamin B12  . Hemoglobin A1c   Return pending test results, for pending if symptoms worsen or fail to improve.    Suanne Marker, MD 10/26/2019, 11:37 AM Certified in Neurology, Neurophysiology and Neuroimaging  [facility] [address] [place], [place] [address] [phone]

## 2019-10-27 ENCOUNTER — Encounter: Payer: Self-pay | Admitting: *Deleted

## 2019-10-27 LAB — HEMOGLOBIN A1C
Est. average glucose Bld gHb Est-mCnc: 103 mg/dL
Hgb A1c MFr Bld: 5.2 % (ref 4.8–5.6)

## 2019-10-27 LAB — VITAMIN B12: Vitamin B-12: 373 pg/mL (ref 232–1245)

## 2019-10-28 ENCOUNTER — Telehealth: Payer: Self-pay | Admitting: Diagnostic Neuroimaging

## 2019-10-28 NOTE — Telephone Encounter (Signed)
[employer] order sent to GI. They will obtain the auth and reach out to the patient to schedule.  

## 2019-11-03 NOTE — Telephone Encounter (Signed)
Rutherford Nail: [id number] (exp. 10/29/19 to 01/27/20)

## 2019-11-09 ENCOUNTER — Ambulatory Visit
Admission: RE | Admit: 2019-11-09 | Discharge: 2019-11-09 | Disposition: A | Discharge status: Home / Self Care | Payer: Managed Care, Other (non HMO) | Source: Ambulatory Visit | Attending: Diagnostic Neuroimaging | Admitting: Diagnostic Neuroimaging

## 2019-11-09 ENCOUNTER — Other Ambulatory Visit: Payer: Self-pay

## 2019-11-09 DIAGNOSIS — R200 Anesthesia of skin: Secondary | ICD-10-CM | POA: Diagnosis not present

## 2019-11-09 MED ORDER — GADOBENATE DIMEGLUMINE 529 MG/ML IV SOLN
17.0000 mL | Freq: Once | INTRAVENOUS | Status: AC | PRN
  Administered 2019-11-09: 17 mL via INTRAVENOUS

## 2019-11-17 ENCOUNTER — Encounter: Payer: Self-pay | Admitting: *Deleted

## 2020-01-03 ENCOUNTER — Encounter: Payer: Self-pay | Admitting: Internal Medicine

## 2020-01-05 ENCOUNTER — Encounter: Payer: Self-pay | Admitting: Internal Medicine

## 2020-01-05 ENCOUNTER — Other Ambulatory Visit: Payer: Self-pay

## 2020-01-05 ENCOUNTER — Ambulatory Visit: Payer: Managed Care, Other (non HMO) | Admitting: Internal Medicine

## 2020-01-05 VITALS — BP 130/70 | HR 80 | Temp 98.7°F | Resp 16 | Ht 70.5 in | Wt 188.2 lb

## 2020-01-05 DIAGNOSIS — F322 Major depressive disorder, single episode, severe without psychotic features: Secondary | ICD-10-CM

## 2020-01-05 DIAGNOSIS — I10 Essential (primary) hypertension: Secondary | ICD-10-CM | POA: Diagnosis not present

## 2020-01-05 MED ORDER — ARIPIPRAZOLE 2 MG PO TABS: 2.0000 mg | ORAL_TABLET | Freq: Every day | ORAL | 0 refills | Status: DC

## 2020-01-05 MED ORDER — VIIBRYD STARTER PACK 10 & 20 MG PO KIT: 1.0000 | PACK | Freq: Every day | ORAL | 0 refills | Status: DC

## 2020-01-05 NOTE — Progress Notes (Signed)
Subjective:  Patient ID: Noah Jones, male    DOB: [birth year]  Age: 34 y.o. MRN: [id number]  CC: Depression  This visit occurred during the SARS-CoV-2 public health emergency.  Safety protocols were in place, including screening questions prior to the visit, additional usage of staff PPE, and extensive cleaning of exam room while observing appropriate contact time as indicated for disinfecting solutions.    HPI [name] presents for f/up -he found out a month ago that he was not begin to get a promotion at work that he expected.  Since then he has been struggling with depression.  After he got the news he was suicidal for about a day but since then has had no suicidal or homicidal ideations.  He has a good support network with his friends and family.  He says he previously took Paxil but does not think he had a good response to it.  He has had a normal appetite and has not lost weight.  He complains of insomnia, anhedonia, and feeling hopeless and helpless.  Outpatient Medications Prior to Visit  Medication Sig Dispense Refill  . fexofenadine (ALLEGRA) 30 MG tablet Take 30 mg by mouth 2 (two) times daily.    Marland Kitchen losartan (COZAAR) 100 MG tablet Take 1 tablet (100 mg total) by mouth daily. 90 tablet 1   No facility-administered medications prior to visit.    ROS Review of Systems  Constitutional: Positive for fatigue. Negative for appetite change and diaphoresis.  HENT: Negative.   Eyes: Negative.   Respiratory: Negative.   Cardiovascular: Negative.   Gastrointestinal: Negative.   Genitourinary: Negative.   Musculoskeletal: Negative.   Hematological: Negative.   Psychiatric/Behavioral: Positive for dysphoric mood and sleep disturbance. Negative for agitation, behavioral problems, confusion, decreased concentration, hallucinations, self-injury and suicidal ideas. The patient is nervous/anxious. The patient is not hyperactive.     Objective:  BP 130/70 (BP Location: Left Arm,  Patient Position: Sitting, Cuff Size: Normal)   Pulse 80   Temp 98.7 F (37.1 C) (Oral)   Resp 16   Ht 5' 10.5" (1.791 m)   Wt 188 lb 4 oz (85.4 kg)   SpO2 97%   BMI 26.63 kg/m   BP Readings from Last 3 Encounters:  01/05/20 130/70  10/26/19 122/76  08/24/19 136/86    Wt Readings from Last 3 Encounters:  01/05/20 188 lb 4 oz (85.4 kg)  10/26/19 190 lb 3.2 oz (86.3 kg)  08/24/19 189 lb (85.7 kg)    Physical Exam Vitals reviewed.  Constitutional:      Appearance: He is not toxic-appearing or diaphoretic.  Neurological:     Mental Status: He is alert.  Psychiatric:        Attention and Perception: He is inattentive.        Mood and Affect: Mood is anxious and depressed. Affect is tearful. Affect is not angry or inappropriate.        Speech: Speech normal. He is communicative. Speech is not rapid and pressured, delayed, slurred or tangential.        Behavior: Behavior is not agitated, slowed, aggressive, withdrawn or hyperactive.        Thought Content: Thought content normal. Thought content is not paranoid or delusional. Thought content does not include homicidal or suicidal ideation. Thought content does not include homicidal or suicidal plan.        Cognition and Memory: Cognition normal.     Lab Results  Component Value Date   WBC 7.3 12/01/2018  HGB 15.4 12/01/2018   HCT 44.4 12/01/2018   PLT 228.0 12/01/2018   GLUCOSE 86 08/24/2019   CHOL 200 12/01/2018   TRIG 177.0 (H) 08/24/2019   HDL 40.40 12/01/2018   LDLDIRECT 131.0 12/01/2018   LDLCALC 117 (H) 11/27/2017   ALT 46 12/01/2018   AST 23 12/01/2018   NA 136 08/24/2019   K 4.1 08/24/2019   CL 103 08/24/2019   CREATININE 0.85 08/24/2019   BUN 25 (H) 08/24/2019   CO2 28 08/24/2019   TSH 1.48 12/01/2018   HGBA1C 5.2 10/26/2019    MR BRAIN W WO CONTRAST  Result Date: 11/11/2019 [facility] NEURO[facility]AG[facility] REPORT STUDY DATE: 11/09/19 PATIENT NAME: Noah Jones DOB: Nov 07, 1985 MRN:  [id number] ORDERING CLINICIAN: Penni Bombard, MD CLINICAL HISTORY: 34 year old male with numbness. EXAM: MR BRAIN W WO CONTRAST TECHNIQUE: MRI of the brain with and without contrast was obtained utilizing 5 mm axial slices with T1, T2, T2 flair, SWI and diffusion weighted views.  T1 sagittal, T2 coronal and postcontrast views in the axial and coronal plane were obtained. CONTRAST: 12m multihance COMPARISON: none IMAGING SITE: [id number](1.5 Tesla MRI)  FINDINGS: No abnormal lesions are seen on diffusion-weighted views to suggest acute ischemia. The cortical sulci, fissures and cisterns are normal in size and appearance. Lateral, third and fourth ventricle are normal in size and appearance. No extra-axial fluid collections are seen. No evidence of mass effect or midline shift.  No abnormal lesions are seen on post contrast views.  On sagittal views the posterior fossa, pituitary gland and corpus callosum are unremarkable. No evidence of intracranial hemorrhage on SWI views. The orbits and their contents, paranasal sinuses and calvarium are unremarkable.  Intracranial flow voids are present.   Normal MRI brain (with and without). INTERPRETING PHYSICIAN: VPenni Bombard MD Certified in Neurology, Neurophysiology and Neuroimaging [facility] [address] [place] [phone]  MR CERVICAL SPINE W WO CONTRAST  Result Date: 11/11/2019 [facility] NEUROIMAGING REPORT STUDY DATE: 11/09/19 PATIENT NAME: Noah AgyemanDOB: 5July 29, 1987MRN: [id number] CLINICIAN: PPenni Bombard MD CLINICAL HISTORY: 34year old male with numbness. EXAM: MR CERVICAL SPINE W WO CONTRAST TECHNIQUE: MRI of the cervical spine was obtained utilizing 3 mm sagittal slices from the posterior fossa down to the T3-4 level with T1, T2 and inversion recovery views. In addition 4 mm axial slices from CV9-5down to T1-2 level were  included with T2 and gradient echo views. CONTRAST: 184mmultihance COMPARISON: none IMAGING SITE: [id number])  FINDINGS: On sagittal views the vertebral bodies have normal height and alignment. Minimal disc bulging at C5-6. The spinal cord is normal in size and appearance. The posterior fossa, pituitary gland and paraspinal soft tissues are unremarkable.  On axial views there is no spinal stenosis or foraminal narrowing. Limited views of the soft tissues of the head and neck are unremarkable.   Normal MRI cervical spine (with and without). INTERPRETING PHYSICIAN: VIPenni BombardMD Certified in Neurology, Neurophysiology and Neuroimaging [facility] [address] StreetSuColumbiarWhite [place] [phone]  Assessment & Plan:   MaBurlieas seen today for depression.  Diagnoses and all orders for this visit:  Essential hypertension-his blood pressure is adequately well controlled.  Current severe episode of major depressive disorder without psychotic features without prior episode (HCC)-I recommended that he treat this with  vilazodone, will increase the dose over time.  I have also recommended that he take an atypical antipsychotic to improve his response to the antidepressant. -     Vilazodone HCl (VIIBRYD STARTER PACK) 10 & 20 MG KIT; Take 1 tablet by mouth daily. -     ARIPiprazole (ABILIFY) 2 MG tablet; Take 1 tablet (2 mg total) by mouth daily.   I am having Jacques Navy start on [employer] and ARIPiprazole. I am also having him maintain his fexofenadine and losartan.  Meds ordered this encounter  Medications  . Vilazodone HCl (VIIBRYD STARTER PACK) 10 & 20 MG KIT    Sig: Take 1 tablet by mouth daily.    Dispense:  1 kit    Refill:  0  . ARIPiprazole (ABILIFY) 2 MG tablet    Sig: Take 1 tablet (2 mg total) by mouth daily.    Dispense:  30 tablet    Refill:  0     Follow-up: Return in about 2  weeks (around 01/19/2020).  Scarlette Calico, MD

## 2020-01-05 NOTE — Patient Instructions (Signed)
Major Depressive Disorder, Adult Major depressive disorder (MDD) is a mental health condition. It may also be called clinical depression or unipolar depression. MDD usually causes feelings of sadness, hopelessness, or helplessness. MDD can also cause physical symptoms. It can interfere with work, school, relationships, and other everyday activities. MDD may be mild, moderate, or severe. It may occur once (single episode major depressive disorder) or it may occur multiple times (recurrent major depressive disorder). What are the causes? The exact cause of this condition is not known. MDD is most likely caused by a combination of things, which may include:  Genetic factors. These are traits that are passed along from parent to child.  Individual factors. Your personality, your behavior, and the way you handle your thoughts and feelings may contribute to MDD. This includes personality traits and behaviors learned from others.  Physical factors, such as: ? Differences in the part of your brain that controls emotion. This part of your brain may be different than it is in people who do not have MDD. ? Long-term (chronic) medical or psychiatric illnesses.  Social factors. Traumatic experiences or major life changes may play a role in the development of MDD. What increases the risk? This condition is more likely to develop in women. The following factors may also make you more likely to develop MDD:  A family history of depression.  Troubled family relationships.  Abnormally low levels of certain brain chemicals.  Traumatic events in childhood, especially abuse or the loss of a parent.  Being under a lot of stress, or long-term stress, especially from upsetting life experiences or losses.  A history of: ? Chronic physical illness. ? Other mental health disorders. ? Substance abuse.  Poor living conditions.  Experiencing social exclusion or discrimination on a regular basis. What are the  signs or symptoms? The main symptoms of MDD typically include:  Constant depressed or irritable mood.  Loss of interest in things and activities. MDD symptoms may also include:  Sleeping or eating too much or too little.  Unexplained weight change.  Fatigue or low energy.  Feelings of worthlessness or guilt.  Difficulty thinking clearly or making decisions.  Thoughts of suicide or of harming others.  Physical agitation or weakness.  Isolation. Severe cases of MDD may also occur with other symptoms, such as:  Delusions or hallucinations, in which you imagine things that are not real (psychotic depression).  Low-level depression that lasts at least a year (chronic depression or persistent depressive disorder).  Extreme sadness and hopelessness (melancholic depression).  Trouble speaking and moving (catatonic depression). How is this diagnosed? This condition may be diagnosed based on:  Your symptoms.  Your medical history, including your mental health history. This may involve tests to evaluate your mental health. You may be asked questions about your lifestyle, including any drug and alcohol use, and how long you have had symptoms of MDD.  A physical exam.  Blood tests to rule out other conditions. You must have a depressed mood and at least four other MDD symptoms most of the day, nearly every day in the same 2-week timeframe before your health care provider can confirm a diagnosis of MDD. How is this treated? This condition is usually treated by mental health professionals, such as psychologists, psychiatrists, and clinical social workers. You may need more than one type of treatment. Treatment may include:  Psychotherapy. This is also called talk therapy or counseling. Types of psychotherapy include: ? Cognitive behavioral therapy (CBT). This type of therapy   teaches you to recognize unhealthy feelings, thoughts, and behaviors, and replace them with positive thoughts  and actions. ? Interpersonal therapy (IPT). This helps you to improve the way you relate to and communicate with others. ? Family therapy. This treatment includes members of your family.  Medicine to treat anxiety and depression, or to help you control certain emotions and behaviors.  Lifestyle changes, such as: ? Limiting alcohol and drug use. ? Exercising regularly. ? Getting plenty of sleep. ? Making healthy eating choices. ? Spending more time outdoors.  Treatments involving stimulation of the brain can be used in situations with extremely severe symptoms, or when medicine or other therapies do not work over time. These treatments include electroconvulsive therapy, transcranial magnetic stimulation, and vagal nerve stimulation. Follow these instructions at home: Activity  Return to your normal activities as told by your health care provider.  Exercise regularly and spend time outdoors as told by your health care provider. General instructions  Take over-the-counter and prescription medicines only as told by your health care provider.  Do not drink alcohol. If you drink alcohol, limit your alcohol intake to no more than 1 drink a day for nonpregnant women and 2 drinks a day for men. One drink equals 12 oz of beer, 5 oz of wine, or 1 oz of hard liquor. Alcohol can affect any antidepressant medicines you are taking. Talk to your health care provider about your alcohol use.  Eat a healthy diet and get plenty of sleep.  Find activities that you enjoy doing, and make time to do them.  Consider joining a support group. Your health care provider may be able to recommend a support group.  Keep all follow-up visits as told by your health care provider. This is important. Where to find more information National Alliance on Mental Illness  www.nami.org [place] National Institute of Mental Health  www.nimh.nih.gov National Suicide Prevention Lifeline  1-800-273-TALK (8255). This is  free, 24-hour help. Contact a health care provider if:  Your symptoms get worse.  You develop new symptoms. Get help right away if:  You self-harm.  You have serious thoughts about hurting yourself or others.  You see, hear, taste, smell, or feel things that are not present (hallucinate). This information is not intended to replace advice given to you by your health care provider. Make sure you discuss any questions you have with your health care provider. Document Revised: 05/02/2017 Document Reviewed: 11/29/2015 Elsevier Patient Education  2020 Elsevier Inc.  

## 2020-01-10 ENCOUNTER — Ambulatory Visit (INDEPENDENT_AMBULATORY_CARE_PROVIDER_SITE_OTHER): Payer: 59 | Admitting: Psychology

## 2020-01-10 DIAGNOSIS — F322 Major depressive disorder, single episode, severe without psychotic features: Secondary | ICD-10-CM

## 2020-01-17 ENCOUNTER — Encounter: Payer: Self-pay | Admitting: Internal Medicine

## 2020-01-18 ENCOUNTER — Other Ambulatory Visit: Payer: Self-pay | Admitting: Internal Medicine

## 2020-01-18 ENCOUNTER — Ambulatory Visit (INDEPENDENT_AMBULATORY_CARE_PROVIDER_SITE_OTHER): Payer: 59 | Admitting: Psychology

## 2020-01-18 DIAGNOSIS — F322 Major depressive disorder, single episode, severe without psychotic features: Secondary | ICD-10-CM | POA: Diagnosis not present

## 2020-01-18 MED ORDER — VIIBRYD 10 MG PO TABS: 10.0000 mg | ORAL_TABLET | Freq: Every day | ORAL | 0 refills | Status: DC

## 2020-01-19 ENCOUNTER — Telehealth: Payer: Self-pay

## 2020-01-19 DIAGNOSIS — F322 Major depressive disorder, single episode, severe without psychotic features: Secondary | ICD-10-CM

## 2020-01-19 NOTE — Telephone Encounter (Signed)
Key: [id number]

## 2020-01-20 MED ORDER — VIIBRYD 10 MG PO TABS: 10.0000 mg | ORAL_TABLET | Freq: Every day | ORAL | 0 refills | Status: DC

## 2020-01-20 NOTE — Addendum Note (Signed)
Addended by: Radford Pax M on: 01/20/2020 01:33 PM   Modules accepted: Orders

## 2020-01-20 NOTE — Telephone Encounter (Signed)
Sample is on my desk, ready for pick up.

## 2020-01-24 ENCOUNTER — Ambulatory Visit (INDEPENDENT_AMBULATORY_CARE_PROVIDER_SITE_OTHER): Payer: 59 | Admitting: Psychologist

## 2020-01-24 DIAGNOSIS — F321 Major depressive disorder, single episode, moderate: Secondary | ICD-10-CM | POA: Diagnosis not present

## 2020-01-28 ENCOUNTER — Other Ambulatory Visit: Payer: Self-pay | Admitting: Internal Medicine

## 2020-01-28 DIAGNOSIS — F322 Major depressive disorder, single episode, severe without psychotic features: Secondary | ICD-10-CM

## 2020-01-31 ENCOUNTER — Ambulatory Visit (INDEPENDENT_AMBULATORY_CARE_PROVIDER_SITE_OTHER): Payer: 59 | Admitting: Psychologist

## 2020-01-31 DIAGNOSIS — F321 Major depressive disorder, single episode, moderate: Secondary | ICD-10-CM | POA: Diagnosis not present

## 2020-02-02 ENCOUNTER — Other Ambulatory Visit: Payer: Self-pay | Admitting: Internal Medicine

## 2020-02-02 DIAGNOSIS — I10 Essential (primary) hypertension: Secondary | ICD-10-CM

## 2020-02-03 ENCOUNTER — Ambulatory Visit: Payer: 59 | Admitting: Psychology

## 2020-02-08 ENCOUNTER — Ambulatory Visit (INDEPENDENT_AMBULATORY_CARE_PROVIDER_SITE_OTHER): Payer: 59 | Admitting: Psychologist

## 2020-02-08 DIAGNOSIS — F321 Major depressive disorder, single episode, moderate: Secondary | ICD-10-CM

## 2020-02-21 ENCOUNTER — Ambulatory Visit (INDEPENDENT_AMBULATORY_CARE_PROVIDER_SITE_OTHER): Payer: 59 | Admitting: Psychologist

## 2020-02-21 DIAGNOSIS — F321 Major depressive disorder, single episode, moderate: Secondary | ICD-10-CM

## 2020-02-28 ENCOUNTER — Ambulatory Visit: Payer: 59 | Admitting: Psychologist

## 2020-03-06 ENCOUNTER — Ambulatory Visit (INDEPENDENT_AMBULATORY_CARE_PROVIDER_SITE_OTHER): Payer: BC Managed Care – PPO | Admitting: Psychologist

## 2020-03-06 DIAGNOSIS — F321 Major depressive disorder, single episode, moderate: Secondary | ICD-10-CM

## 2020-03-13 ENCOUNTER — Ambulatory Visit: Payer: 59 | Admitting: Psychologist

## 2020-03-20 ENCOUNTER — Ambulatory Visit (INDEPENDENT_AMBULATORY_CARE_PROVIDER_SITE_OTHER): Payer: BC Managed Care – PPO | Admitting: Psychologist

## 2020-03-20 DIAGNOSIS — F321 Major depressive disorder, single episode, moderate: Secondary | ICD-10-CM

## 2020-03-27 ENCOUNTER — Ambulatory Visit (INDEPENDENT_AMBULATORY_CARE_PROVIDER_SITE_OTHER): Payer: BC Managed Care – PPO | Admitting: Psychologist

## 2020-03-27 DIAGNOSIS — F321 Major depressive disorder, single episode, moderate: Secondary | ICD-10-CM | POA: Diagnosis not present

## 2020-04-03 ENCOUNTER — Ambulatory Visit (INDEPENDENT_AMBULATORY_CARE_PROVIDER_SITE_OTHER): Payer: BC Managed Care – PPO | Admitting: Psychologist

## 2020-04-03 DIAGNOSIS — F321 Major depressive disorder, single episode, moderate: Secondary | ICD-10-CM | POA: Diagnosis not present

## 2020-04-10 ENCOUNTER — Ambulatory Visit (INDEPENDENT_AMBULATORY_CARE_PROVIDER_SITE_OTHER): Payer: BC Managed Care – PPO | Admitting: Psychologist

## 2020-04-10 DIAGNOSIS — F321 Major depressive disorder, single episode, moderate: Secondary | ICD-10-CM | POA: Diagnosis not present

## 2020-04-17 ENCOUNTER — Ambulatory Visit (INDEPENDENT_AMBULATORY_CARE_PROVIDER_SITE_OTHER): Payer: BC Managed Care – PPO | Admitting: Psychologist

## 2020-04-17 DIAGNOSIS — F321 Major depressive disorder, single episode, moderate: Secondary | ICD-10-CM | POA: Diagnosis not present

## 2020-04-19 ENCOUNTER — Other Ambulatory Visit: Payer: Self-pay | Admitting: Internal Medicine

## 2020-04-19 ENCOUNTER — Encounter: Payer: Self-pay | Admitting: Internal Medicine

## 2020-04-19 DIAGNOSIS — F322 Major depressive disorder, single episode, severe without psychotic features: Secondary | ICD-10-CM

## 2020-04-19 MED ORDER — ARIPIPRAZOLE 2 MG PO TABS: 2.0000 mg | ORAL_TABLET | Freq: Every day | ORAL | 1 refills | Status: DC

## 2020-04-19 MED ORDER — VIIBRYD 10 MG PO TABS: 10.0000 mg | ORAL_TABLET | Freq: Every day | ORAL | 1 refills | Status: DC

## 2020-04-22 ENCOUNTER — Other Ambulatory Visit: Payer: Self-pay | Admitting: Internal Medicine

## 2020-04-22 DIAGNOSIS — F322 Major depressive disorder, single episode, severe without psychotic features: Secondary | ICD-10-CM

## 2020-04-24 ENCOUNTER — Ambulatory Visit: Payer: 59 | Admitting: Psychologist

## 2020-05-01 ENCOUNTER — Ambulatory Visit: Payer: 59 | Admitting: Psychologist

## 2020-05-08 ENCOUNTER — Ambulatory Visit: Payer: 59 | Admitting: Psychologist

## 2020-05-09 ENCOUNTER — Other Ambulatory Visit: Payer: Self-pay | Admitting: Internal Medicine

## 2020-05-09 DIAGNOSIS — F322 Major depressive disorder, single episode, severe without psychotic features: Secondary | ICD-10-CM

## 2020-05-09 MED ORDER — VIIBRYD 10 MG PO TABS: 10.0000 mg | ORAL_TABLET | Freq: Every day | ORAL | 1 refills | Status: DC

## 2020-05-15 ENCOUNTER — Ambulatory Visit: Payer: 59 | Admitting: Psychologist

## 2020-05-22 ENCOUNTER — Ambulatory Visit: Payer: 59 | Admitting: Psychologist

## 2020-05-29 ENCOUNTER — Ambulatory Visit: Payer: 59 | Admitting: Psychologist

## 2020-08-10 ENCOUNTER — Telehealth: Payer: Self-pay | Admitting: Internal Medicine

## 2020-08-10 DIAGNOSIS — I10 Essential (primary) hypertension: Secondary | ICD-10-CM

## 2020-08-11 NOTE — Telephone Encounter (Signed)
Patient requesting short supply until 3/29 appointment

## 2020-08-12 ENCOUNTER — Other Ambulatory Visit: Payer: Self-pay | Admitting: Internal Medicine

## 2020-08-12 DIAGNOSIS — I10 Essential (primary) hypertension: Secondary | ICD-10-CM

## 2020-08-12 MED ORDER — LOSARTAN POTASSIUM 100 MG PO TABS: 100.0000 mg | ORAL_TABLET | Freq: Every day | ORAL | 0 refills | Status: DC

## 2020-08-29 ENCOUNTER — Ambulatory Visit: Payer: BC Managed Care – PPO | Admitting: Internal Medicine

## 2020-08-29 ENCOUNTER — Other Ambulatory Visit: Payer: Self-pay

## 2020-08-29 ENCOUNTER — Encounter: Payer: Self-pay | Admitting: Internal Medicine

## 2020-08-29 VITALS — BP 136/76 | HR 88 | Temp 98.7°F | Resp 16 | Ht 70.5 in | Wt 198.0 lb

## 2020-08-29 DIAGNOSIS — E781 Pure hyperglyceridemia: Secondary | ICD-10-CM | POA: Diagnosis not present

## 2020-08-29 DIAGNOSIS — F5101 Primary insomnia: Secondary | ICD-10-CM | POA: Diagnosis not present

## 2020-08-29 DIAGNOSIS — Z0000 Encounter for general adult medical examination without abnormal findings: Secondary | ICD-10-CM

## 2020-08-29 DIAGNOSIS — I10 Essential (primary) hypertension: Secondary | ICD-10-CM | POA: Diagnosis not present

## 2020-08-29 DIAGNOSIS — F322 Major depressive disorder, single episode, severe without psychotic features: Secondary | ICD-10-CM

## 2020-08-29 DIAGNOSIS — E559 Vitamin D deficiency, unspecified: Secondary | ICD-10-CM

## 2020-08-29 LAB — HEPATIC FUNCTION PANEL
ALT: 26 U/L (ref 0–53)
AST: 17 U/L (ref 0–37)
Albumin: 4.7 g/dL (ref 3.5–5.2)
Alkaline Phosphatase: 82 U/L (ref 39–117)
Bilirubin, Direct: 0.1 mg/dL (ref 0.0–0.3)
Total Bilirubin: 0.7 mg/dL (ref 0.2–1.2)
Total Protein: 7.6 g/dL (ref 6.0–8.3)

## 2020-08-29 LAB — CBC WITH DIFFERENTIAL/PLATELET
Basophils Absolute: 0.1 10*3/uL (ref 0.0–0.1)
Basophils Relative: 1 % (ref 0.0–3.0)
Eosinophils Absolute: 0.1 10*3/uL (ref 0.0–0.7)
Eosinophils Relative: 1.2 % (ref 0.0–5.0)
HCT: 45.2 % (ref 39.0–52.0)
Hemoglobin: 15.6 g/dL (ref 13.0–17.0)
Lymphocytes Relative: 24.2 % (ref 12.0–46.0)
Lymphs Abs: 1.3 10*3/uL (ref 0.7–4.0)
MCHC: 34.4 g/dL (ref 30.0–36.0)
MCV: 89.9 fl (ref 78.0–100.0)
Monocytes Absolute: 0.5 10*3/uL (ref 0.1–1.0)
Monocytes Relative: 9.1 % (ref 3.0–12.0)
Neutro Abs: 3.6 10*3/uL (ref 1.4–7.7)
Neutrophils Relative %: 64.5 % (ref 43.0–77.0)
Platelets: 195 10*3/uL (ref 150.0–400.0)
RBC: 5.03 Mil/uL (ref 4.22–5.81)
RDW: 13 % (ref 11.5–15.5)
WBC: 5.5 10*3/uL (ref 4.0–10.5)

## 2020-08-29 LAB — BASIC METABOLIC PANEL
BUN: 26 mg/dL — ABNORMAL HIGH (ref 6–23)
CO2: 27 mEq/L (ref 19–32)
Calcium: 9.7 mg/dL (ref 8.4–10.5)
Chloride: 104 mEq/L (ref 96–112)
Creatinine, Ser: 0.85 mg/dL (ref 0.40–1.50)
GFR: 112.31 mL/min (ref >60.00)
Glucose, Bld: 117 mg/dL — ABNORMAL HIGH (ref 70–99)
Potassium: 4 mEq/L (ref 3.5–5.1)
Sodium: 138 mEq/L (ref 135–145)

## 2020-08-29 LAB — LIPID PANEL
Cholesterol: 179 mg/dL (ref 0–200)
HDL: 35.5 mg/dL — ABNORMAL LOW (ref >39.00)
Total CHOL/HDL Ratio: 5
Triglycerides: 457 mg/dL — ABNORMAL HIGH (ref 0.0–149.0)

## 2020-08-29 LAB — VITAMIN D 25 HYDROXY (VIT D DEFICIENCY, FRACTURES): VITD: 17.16 ng/mL — ABNORMAL LOW (ref 30.00–100.00)

## 2020-08-29 LAB — TSH: TSH: 0.88 u[IU]/mL (ref 0.35–4.50)

## 2020-08-29 LAB — LDL CHOLESTEROL, DIRECT: Direct LDL: 108 mg/dL

## 2020-08-29 MED ORDER — CHOLECALCIFEROL 1.25 MG (50000 UT) PO CAPS: 50000.0000 [IU] | ORAL_CAPSULE | ORAL | 0 refills | Status: DC

## 2020-08-29 MED ORDER — OMEGA-3-ACID ETHYL ESTERS 1 G PO CAPS: 2.0000 g | ORAL_CAPSULE | Freq: Two times a day (BID) | ORAL | 1 refills | Status: DC

## 2020-08-29 NOTE — Patient Instructions (Signed)

## 2020-08-29 NOTE — Progress Notes (Signed)
Subjective:  Patient ID: Noah Jones, male    DOB: Mar 28, 1986  Age: 35 y.o. MRN: [id number]  CC: Hypertension and Annual Exam  This visit occurred during the SARS-CoV-2 public health emergency.  Safety protocols were in place, including screening questions prior to the visit, additional usage of staff PPE, and extensive cleaning of exam room while observing appropriate contact time as indicated for disinfecting solutions.    HPI Noah Jones presents for a CPX.  He tells me that his blood pressure has been well controlled.  He is tolerating the ARB well.  He complains of weight gain.  He tells me he is doing well on the combination of Abilify and Viibryd.  His sleep has improved.  Outpatient Medications Prior to Visit  Medication Sig Dispense Refill  . ARIPiprazole (ABILIFY) 2 MG tablet Take 1 tablet (2 mg total) by mouth daily. 90 tablet 1  . fexofenadine (ALLEGRA) 30 MG tablet Take 30 mg by mouth 2 (two) times daily.    Marland Kitchen losartan (COZAAR) 100 MG tablet Take 1 tablet (100 mg total) by mouth daily. 30 tablet 0  . Vilazodone HCl (VIIBRYD) 10 MG TABS Take 1 tablet (10 mg total) by mouth daily. 90 tablet 1   No facility-administered medications prior to visit.    ROS Review of Systems  Constitutional: Positive for unexpected weight change. Negative for diaphoresis and fatigue.  HENT: Negative.   Eyes: Negative.   Respiratory: Negative for cough, chest tightness, shortness of breath and wheezing.   Cardiovascular: Negative for chest pain, palpitations and leg swelling.  Gastrointestinal: Negative for abdominal pain, constipation, diarrhea, nausea and vomiting.  Endocrine: Negative.   Genitourinary: Negative.  Negative for difficulty urinating, penile swelling, scrotal swelling, testicular pain and urgency.  Musculoskeletal: Negative.  Negative for arthralgias and myalgias.  Skin: Negative.  Negative for color change.  Neurological: Negative.  Negative for dizziness and  weakness.  Hematological: Negative for adenopathy. Does not bruise/bleed easily.  Psychiatric/Behavioral: Positive for sleep disturbance. Negative for dysphoric mood and suicidal ideas. The patient is not nervous/anxious.     Objective:  BP 136/76 (BP Location: Left Arm, Patient Position: Sitting, Cuff Size: Large)   Pulse 88   Temp 98.7 F (37.1 C) (Oral)   Resp 16   Ht 5' 10.5" (1.791 m)   Wt 198 lb (89.8 kg)   SpO2 98%   BMI 28.01 kg/m   BP Readings from Last 3 Encounters:  08/29/20 136/76  01/05/20 130/70  10/26/19 122/76    Wt Readings from Last 3 Encounters:  08/29/20 198 lb (89.8 kg)  01/05/20 188 lb 4 oz (85.4 kg)  10/26/19 190 lb 3.2 oz (86.3 kg)    Physical Exam Vitals reviewed.  HENT:     Nose: Nose normal.     Mouth/Throat:     Mouth: Mucous membranes are moist.  Eyes:     General: No scleral icterus.    Conjunctiva/sclera: Conjunctivae normal.  Cardiovascular:     Rate and Rhythm: Normal rate and regular rhythm.     Heart sounds: No murmur heard.   Pulmonary:     Effort: Pulmonary effort is normal.     Breath sounds: No stridor. No wheezing, rhonchi or rales.  Abdominal:     General: Abdomen is flat.     Tenderness: There is no abdominal tenderness.  Musculoskeletal:        General: Normal range of motion.     Cervical back: Neck supple.     Right  lower leg: No edema.     Left lower leg: No edema.  Lymphadenopathy:     Cervical: No cervical adenopathy.  Skin:    General: Skin is warm and dry.  Neurological:     General: No focal deficit present.     Mental Status: He is alert and oriented to person, place, and time. Mental status is at baseline.  Psychiatric:        Mood and Affect: Mood normal.        Behavior: Behavior normal.        Thought Content: Thought content normal.        Judgment: Judgment normal.     Lab Results  Component Value Date   WBC 5.5 08/29/2020   HGB 15.6 08/29/2020   HCT 45.2 08/29/2020   PLT 195.0  08/29/2020   GLUCOSE 117 (H) 08/29/2020   CHOL 179 08/29/2020   TRIG (H) 08/29/2020    457.0 Triglyceride is over 400; calculations on Lipids are invalid.   HDL 35.50 (L) 08/29/2020   LDLDIRECT 108.0 08/29/2020   LDLCALC 117 (H) 11/27/2017   ALT 26 08/29/2020   AST 17 08/29/2020   NA 138 08/29/2020   K 4.0 08/29/2020   CL 104 08/29/2020   CREATININE 0.85 08/29/2020   BUN 26 (H) 08/29/2020   CO2 27 08/29/2020   TSH 0.88 08/29/2020   HGBA1C 5.2 10/26/2019    MR BRAIN W WO CONTRAST  Result Date: 11/11/2019 [facility] NEUROIMAGING REPORT STUDY DATE: 11/09/19 PATIENT NAME: Noah RoachMatthew Jones DOB: 08-05-1985 MRN: [id number] ORDERING CLINICIAN: Suanne MarkerPenumalli, Vikram R, MD CLINICAL HISTORY: 35 year old male with numbness. EXAM: MR BRAIN W WO CONTRAST TECHNIQUE: MRI of the brain with and without contrast was obtained utilizing 5 mm axial slices with T1, T2, T2 flair, SWI and diffusion weighted views.  T1 sagittal, T2 coronal and postcontrast views in the axial and coronal plane were obtained. CONTRAST: 17ml multihance COMPARISON: none IMAGING SITE: [facility] [address] (1.5 Tesla MRI)  FINDINGS: No abnormal lesions are seen on diffusion-weighted views to suggest acute ischemia. The cortical sulci, fissures and cisterns are normal in size and appearance. Lateral, third and fourth ventricle are normal in size and appearance. No extra-axial fluid collections are seen. No evidence of mass effect or midline shift.  No abnormal lesions are seen on post contrast views.  On sagittal views the posterior fossa, pituitary gland and corpus callosum are unremarkable. No evidence of intracranial hemorrhage on SWI views. The orbits and their contents, paranasal sinuses and calvarium are unremarkable.  Intracranial flow voids are present.   Normal MRI brain (with and without). INTERPRETING PHYSICIAN: Suanne MarkerVIKRAM R. PENUMALLI, MD Certified in Neurology, Neurophysiology and Neuroimaging [facility] [address], [address] [place], [place] [address] [phone]   MR CERVICAL SPINE W WO CONTRAST  Result Date: 11/11/2019 [facility] NEUROIMAGING REPORT STUDY DATE: 11/09/19 PATIENT NAME: Noah RoachMatthew Jones DOB: 08-05-1985 MRN: [id number] ORDERING CLINICIAN: Suanne MarkerPenumalli, Vikram R, MD CLINICAL HISTORY: 35 year old male with numbness. EXAM: MR CERVICAL SPINE W WO CONTRAST TECHNIQUE: MRI of the cervical spine was obtained utilizing 3 mm sagittal slices from the posterior fossa down to the T3-4 level with T1, T2 and inversion recovery views. In addition 4 mm axial slices from C2-3 down to T1-2 level were included with T2 and gradient echo views. CONTRAST: 17ml multihance COMPARISON: none IMAGING SITE: [facility] [address] (1.5 Tesla MRI)  FINDINGS: On sagittal views the  vertebral bodies have normal height and alignment. Minimal disc bulging at C5-6. The spinal cord is normal in size and appearance. The posterior fossa, pituitary gland and paraspinal soft tissues are unremarkable.  On axial views there is no spinal stenosis or foraminal narrowing. Limited views of the soft tissues of the head and neck are unremarkable.   Normal MRI cervical spine (with and without). INTERPRETING PHYSICIAN: Suanne Marker, MD Certified in Neurology, Neurophysiology and Neuroimaging [facility] [address], [address] [place], [place] [address] [phone]    Assessment & Plan:   Orvell was seen today for hypertension and annual exam.  Diagnoses and all orders for this visit:  Primary hypertension- His blood pressure is adequately well controlled. -     Basic metabolic panel; Future -     CBC with Differential/Platelet; Future -     TSH; Future -     Hepatic function panel; Future -     VITAMIN D 25 Hydroxy (Vit-D Deficiency, Fractures); Future -     VITAMIN D 25 Hydroxy (Vit-D Deficiency, Fractures) -     Hepatic function panel -     TSH -      CBC with Differential/Platelet -     Basic metabolic panel  Routine general medical examination at a health care facility- Exam completed, labs reviewed, vaccines reviewed, no cancer screenings are indicated, patient education was given. 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  Labs are negative for any secondary causes. -     CBC with Differential/Platelet; Future -     Hepatic function panel; Future -     VITAMIN D 25 Hydroxy (Vit-D Deficiency, Fractures); Future -     VITAMIN D 25 Hydroxy (Vit-D Deficiency, Fractures) -     Hepatic function panel -     CBC with Differential/Platelet  Persistent insomnia of non-organic origin -     Hepatic function panel; Future -     VITAMIN D 25 Hydroxy (Vit-D Deficiency, Fractures); Future -     VITAMIN D 25 Hydroxy (Vit-D Deficiency, Fractures) -     Hepatic function panel  Pure hypertriglyceridemia -     omega-3 acid ethyl esters (LOVAZA) 1 g capsule; Take 2 capsules (2 g total) by mouth 2 (two) times daily.  Vitamin D deficiency disease -     Cholecalciferol 1.25 MG (50000 UT) capsule; Take 1 capsule (50,000 Units total) by mouth once a week.  Other orders -     LDL cholesterol, direct   I am having Noah Jones start on omega-3 acid ethyl esters and Cholecalciferol. I am also having him maintain his fexofenadine, ARIPiprazole, Viibryd, and losartan.  Meds ordered this encounter  Medications  . omega-3 acid ethyl esters (LOVAZA) 1 g capsule    Sig: Take 2 capsules (2 g total) by mouth 2 (two) times daily.    Dispense:  360 capsule    Refill:  1  . Cholecalciferol 1.25 MG (50000 UT) capsule    Sig: Take 1 capsule (50,000  Units total) by mouth once a week.  Dispense:  12 capsule    Refill:  0     Follow-up: Return in about 6 months (around 03/01/2021).  Sanda Linger, MD

## 2020-08-30 LAB — HIV ANTIBODY (ROUTINE TESTING W REFLEX): HIV 1&2 Ab, 4th Generation: NONREACTIVE

## 2020-08-30 LAB — HEPATITIS C ANTIBODY
Hepatitis C Ab: NONREACTIVE
SIGNAL TO CUT-OFF: 0.01 (ref <1.00)

## 2020-09-04 ENCOUNTER — Other Ambulatory Visit: Payer: Self-pay | Admitting: Internal Medicine

## 2020-09-04 DIAGNOSIS — I10 Essential (primary) hypertension: Secondary | ICD-10-CM

## 2020-09-24 ENCOUNTER — Other Ambulatory Visit: Payer: Self-pay | Admitting: Internal Medicine

## 2020-09-24 DIAGNOSIS — F322 Major depressive disorder, single episode, severe without psychotic features: Secondary | ICD-10-CM

## 2020-11-03 ENCOUNTER — Other Ambulatory Visit: Payer: Self-pay | Admitting: Internal Medicine

## 2020-11-03 DIAGNOSIS — F322 Major depressive disorder, single episode, severe without psychotic features: Secondary | ICD-10-CM

## 2020-11-11 ENCOUNTER — Other Ambulatory Visit: Payer: Self-pay | Admitting: Internal Medicine

## 2020-11-11 DIAGNOSIS — F322 Major depressive disorder, single episode, severe without psychotic features: Secondary | ICD-10-CM

## 2020-11-18 ENCOUNTER — Other Ambulatory Visit: Payer: Self-pay | Admitting: Internal Medicine

## 2020-11-18 DIAGNOSIS — E559 Vitamin D deficiency, unspecified: Secondary | ICD-10-CM

## 2020-11-20 DIAGNOSIS — Z20822 Contact with and (suspected) exposure to covid-19: Secondary | ICD-10-CM | POA: Diagnosis not present

## 2021-01-23 DIAGNOSIS — Z20822 Contact with and (suspected) exposure to covid-19: Secondary | ICD-10-CM | POA: Diagnosis not present

## 2021-01-25 ENCOUNTER — Telehealth: Payer: Self-pay | Admitting: Internal Medicine

## 2021-01-25 ENCOUNTER — Encounter: Payer: Self-pay | Admitting: Family Medicine

## 2021-01-25 ENCOUNTER — Telehealth: Payer: BC Managed Care – PPO | Admitting: Family Medicine

## 2021-01-25 VITALS — Temp 99.5°F | Ht 70.0 in | Wt 194.0 lb

## 2021-01-25 DIAGNOSIS — U071 COVID-19: Secondary | ICD-10-CM

## 2021-01-25 MED ORDER — BENZONATATE 200 MG PO CAPS: 200.0000 mg | ORAL_CAPSULE | Freq: Two times a day (BID) | ORAL | 0 refills | Status: DC | PRN

## 2021-01-25 NOTE — Telephone Encounter (Signed)
Noted  

## 2021-01-25 NOTE — Telephone Encounter (Signed)
Patient calling in with  respiratory symptoms: Shortness of breath, chest pain, palpitations or other red words send to Triage  Does the patient have a fever over 100 or positive COVID  test within the last 5 days?   Yes, please schedule virtual visit    Does the patient have 2 or more of the following symptoms?  Cough, sore throat, runny nose, chills, loss of taste or smell   Yes, please schedule virtual visit    **Scheduled VV w/ Doreene Burke @ [facility] location**

## 2021-01-25 NOTE — Progress Notes (Signed)
Established Patient Office Visit  Subjective:  Patient ID: Noah Jones, male    DOB: 1986/01/20  Age: 35 y.o. MRN: [id number]  CC:  Chief Complaint  Patient presents with   Covid Positive    Positive covid on 01/24/21, fever, cough, body aches symptoms x 2 days. Sore throat on 01/21/21    HPI Noah Jones presents for 2-4 day history of sorethroat, cough, fever, arthralgias. Denies wheezing or history of asthma. Sp headache that has resolved. Status post phizer vaccine with booster. Fever is low grade.   Past Medical History:  Diagnosis Date   Allergy    Headache     Past Surgical History:  Procedure Laterality Date   INGUINAL HERNIA REPAIR     SPINE SURGERY     WISDOM TOOTH EXTRACTION      Family History  Problem Relation Age of Onset   Hypertension Mother    Hypertension Father    Stroke Maternal Grandfather    Dementia Paternal Grandmother    Cancer - Lung Other    Hypercholesterolemia Paternal Uncle    Hypertension Brother    Cancer Neg Hx    Diabetes Neg Hx    Early death Neg Hx    Heart disease Neg Hx    Alcohol abuse Neg Hx     Social History   Socioeconomic History   Marital status: Single    Spouse name: Not on file   Number of children: Not on file   Years of education: Not on file   Highest education level: Bachelor's degree (e.g., BA, AB, BS)  Occupational History   Occupation: Chiropodist: OTHER    Comment: Sales executive.  Tobacco Use   Smoking status: Never   Smokeless tobacco: Never  Vaping Use   Vaping Use: Never used  Substance and Sexual Activity   Alcohol use: Not Currently    Alcohol/week: 0.0 standard drinks   Drug use: No   Sexual activity: Never  Other Topics Concern   Not on file  Social History Narrative   Not on file   Social Determinants of Health   Financial Resource Strain: Not on file  Food Insecurity: Not on file  Transportation Needs: Not on file  Physical Activity: Not on file  Stress:  Not on file  Social Connections: Not on file  Intimate Partner Violence: Not on file    Outpatient Medications Prior to Visit  Medication Sig Dispense Refill   ARIPiprazole (ABILIFY) 2 MG tablet TAKE 1 TABLET BY MOUTH EVERY DAY 90 tablet 1   D3-50 1.25 MG (50000 UT) capsule TAKE 1 CAPSULE BY MOUTH ONE TIME PER WEEK 12 capsule 0   fexofenadine (ALLEGRA) 30 MG tablet Take 30 mg by mouth 2 (two) times daily.     losartan (COZAAR) 100 MG tablet TAKE 1 TABLET BY MOUTH EVERY DAY 90 tablet 1   omega-3 acid ethyl esters (LOVAZA) 1 g capsule Take 2 capsules (2 g total) by mouth 2 (two) times daily. 360 capsule 1   VIIBRYD 10 MG TABS TAKE 1 TABLET BY MOUTH EVERY DAY 90 tablet 1   No facility-administered medications prior to visit.    Allergies  Allergen Reactions   Other Other (See Comments)    Mayonnaise-Stomach upset; Pickles-Vomiting    ROS Review of Systems  Constitutional:  Negative for diaphoresis, fatigue, fever and unexpected weight change.  HENT:  Positive for congestion, postnasal drip and sore throat.   Respiratory:  Positive for cough.  Negative for shortness of breath and wheezing.   Cardiovascular: Negative.   Gastrointestinal:  Negative for abdominal pain, nausea and vomiting.  Musculoskeletal:  Positive for arthralgias. Negative for myalgias.  Neurological:  Negative for headaches.     Objective:    Physical Exam Vitals and nursing note reviewed.  Constitutional:      General: He is not in acute distress.    Appearance: Normal appearance. He is not ill-appearing, toxic-appearing or diaphoretic.  HENT:     Head: Normocephalic and atraumatic.  Pulmonary:     Effort: Pulmonary effort is normal.  Neurological:     Mental Status: He is alert and oriented to person, place, and time.  Psychiatric:        Mood and Affect: Mood normal.        Behavior: Behavior normal.    Temp 99.5 F (37.5 C) (Oral)   Ht 5\' 10"  (1.778 m)   Wt 194 lb (88 kg)   BMI 27.84 kg/m  Wt  Readings from Last 3 Encounters:  01/25/21 194 lb (88 kg)  08/29/20 198 lb (89.8 kg)  01/05/20 188 lb 4 oz (85.4 kg)     Health Maintenance Due  Topic Date Due   INFLUENZA VACCINE  01/01/2021    There are no preventive care reminders to display for this patient.  Lab Results  Component Value Date   TSH 0.88 08/29/2020   Lab Results  Component Value Date   WBC 5.5 08/29/2020   HGB 15.6 08/29/2020   HCT 45.2 08/29/2020   MCV 89.9 08/29/2020   PLT 195.0 08/29/2020   Lab Results  Component Value Date   NA 138 08/29/2020   K 4.0 08/29/2020   CO2 27 08/29/2020   GLUCOSE 117 (H) 08/29/2020   BUN 26 (H) 08/29/2020   CREATININE 0.85 08/29/2020   BILITOT 0.7 08/29/2020   ALKPHOS 82 08/29/2020   AST 17 08/29/2020   ALT 26 08/29/2020   PROT 7.6 08/29/2020   ALBUMIN 4.7 08/29/2020   CALCIUM 9.7 08/29/2020   GFR 112.31 08/29/2020   Lab Results  Component Value Date   CHOL 179 08/29/2020   Lab Results  Component Value Date   HDL 35.50 (L) 08/29/2020   Lab Results  Component Value Date   LDLCALC 117 (H) 11/27/2017   Lab Results  Component Value Date   TRIG (H) 08/29/2020    457.0 Triglyceride is over 400; calculations on Lipids are invalid.   Lab Results  Component Value Date   CHOLHDL 5 08/29/2020   Lab Results  Component Value Date   HGBA1C 5.2 10/26/2019      Assessment & Plan:   Problem List Items Addressed This Visit   None Visit Diagnoses     COVID-19    -  Primary   Relevant Medications   benzonatate (TESSALON) 200 MG capsule       Meds ordered this encounter  Medications   benzonatate (TESSALON) 200 MG capsule    Sig: Take 1 capsule (200 mg total) by mouth 2 (two) times daily as needed for cough.    Dispense:  20 capsule    Refill:  0    Follow-up: Return in about 5 years (around 01/25/2026), or if symptoms worsen or fail to improve.  Will continue Tylenol/Advil, DayQuil NyQuil as needed.  Have added Tessalon as needed.  We will  continue to quarantine through Sunday.  Okay for work Monday.  Follow-up in 5 days if not improving.  Thursday  Doreene Burke, MD  Virtual Visit via Video Note  I connected with Noah Jones on 01/25/21 at  4:00 PM EDT by a video enabled telemedicine application and verified that I am speaking with the correct person using two identifiers.  Location: Patient: home alone in a room.  Provider: work   I discussed the limitations of evaluation and management by telemedicine and the availability of in person appointments. The patient expressed understanding and agreed to proceed.  History of Present Illness:    Observations/Objective:   Assessment and Plan:   Follow Up Instructions:    I discussed the assessment and treatment plan with the patient. The patient was provided an opportunity to ask questions and all were answered. The patient agreed with the plan and demonstrated an understanding of the instructions.   The patient was advised to call back or seek an in-person evaluation if the symptoms worsen or if the condition fails to improve as anticipated.  I provided 22 minutes of non-face-to-face time during this encounter.   Mliss Sax, MD

## 2021-02-10 ENCOUNTER — Other Ambulatory Visit: Payer: Self-pay | Admitting: Internal Medicine

## 2021-02-10 DIAGNOSIS — E559 Vitamin D deficiency, unspecified: Secondary | ICD-10-CM

## 2021-02-26 ENCOUNTER — Other Ambulatory Visit: Payer: Self-pay | Admitting: Internal Medicine

## 2021-02-26 DIAGNOSIS — I10 Essential (primary) hypertension: Secondary | ICD-10-CM

## 2021-03-02 ENCOUNTER — Other Ambulatory Visit: Payer: Self-pay | Admitting: Internal Medicine

## 2021-03-02 DIAGNOSIS — E781 Pure hyperglyceridemia: Secondary | ICD-10-CM

## 2021-03-03 ENCOUNTER — Other Ambulatory Visit: Payer: Self-pay | Admitting: Internal Medicine

## 2021-03-03 DIAGNOSIS — E781 Pure hyperglyceridemia: Secondary | ICD-10-CM

## 2021-05-01 ENCOUNTER — Other Ambulatory Visit: Payer: Self-pay | Admitting: Internal Medicine

## 2021-05-01 DIAGNOSIS — F322 Major depressive disorder, single episode, severe without psychotic features: Secondary | ICD-10-CM

## 2021-05-08 ENCOUNTER — Other Ambulatory Visit: Payer: Self-pay | Admitting: Internal Medicine

## 2021-05-08 DIAGNOSIS — E559 Vitamin D deficiency, unspecified: Secondary | ICD-10-CM

## 2021-05-19 ENCOUNTER — Other Ambulatory Visit: Payer: Self-pay | Admitting: Internal Medicine

## 2021-05-19 DIAGNOSIS — I10 Essential (primary) hypertension: Secondary | ICD-10-CM

## 2021-08-23 ENCOUNTER — Other Ambulatory Visit: Payer: Self-pay | Admitting: Internal Medicine

## 2021-08-23 DIAGNOSIS — I10 Essential (primary) hypertension: Secondary | ICD-10-CM

## 2021-08-27 IMAGING — DX DG CERVICAL SPINE COMPLETE 4+V
5 series · 5 of 5 positions shown · non-contrast
Comparison: None.

CLINICAL DATA: Neck pain, no known injury.

EXAM:
CERVICAL SPINE - COMPLETE 4+ VIEW

[cervical spine lat]
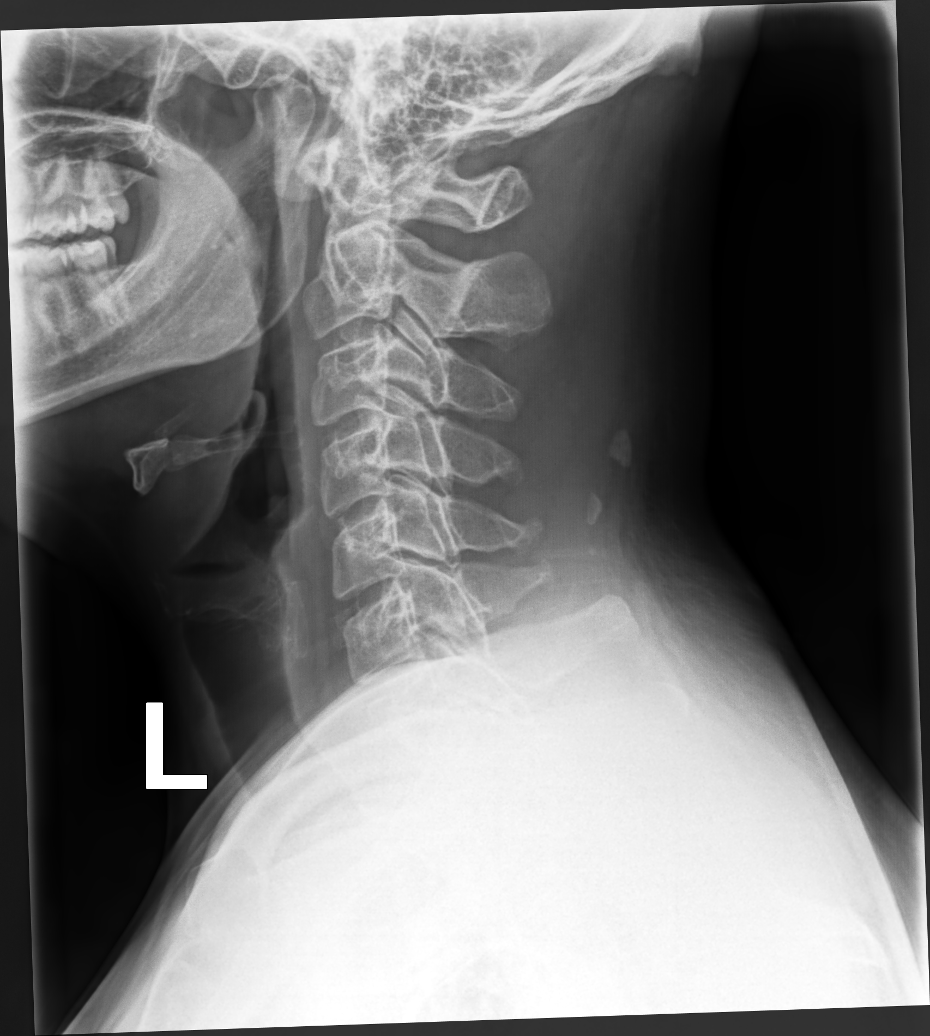

[cervical spine mlo (1 of 2)]
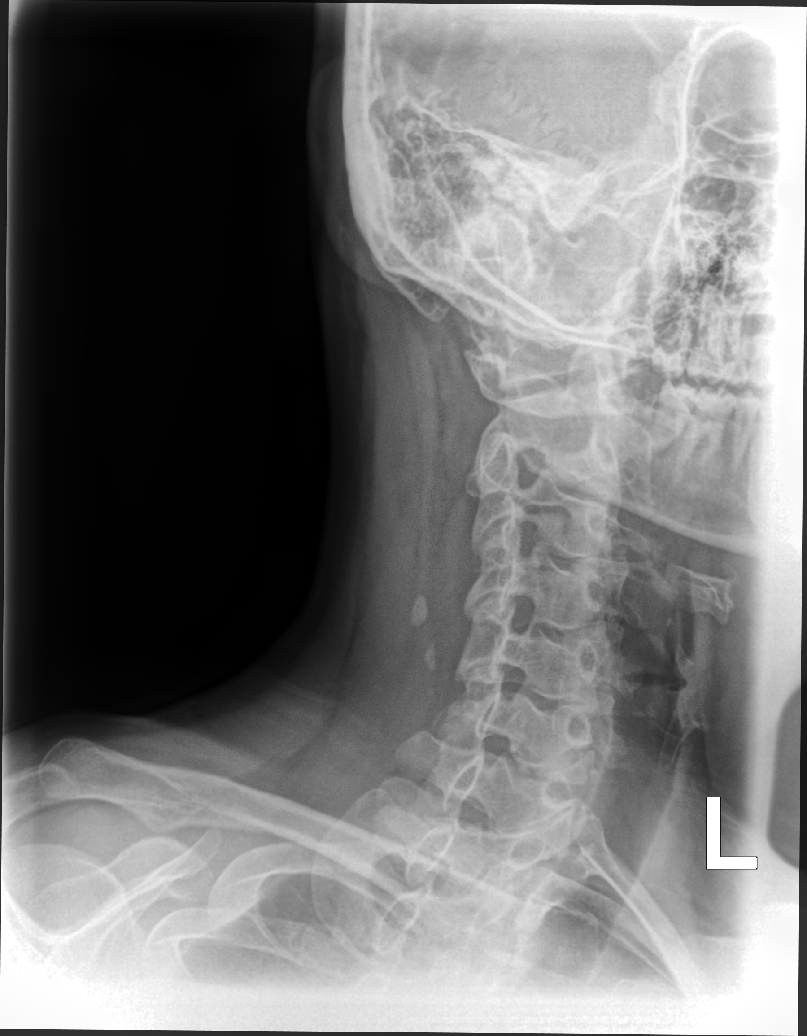

[cervical spine mlo (2 of 2)]
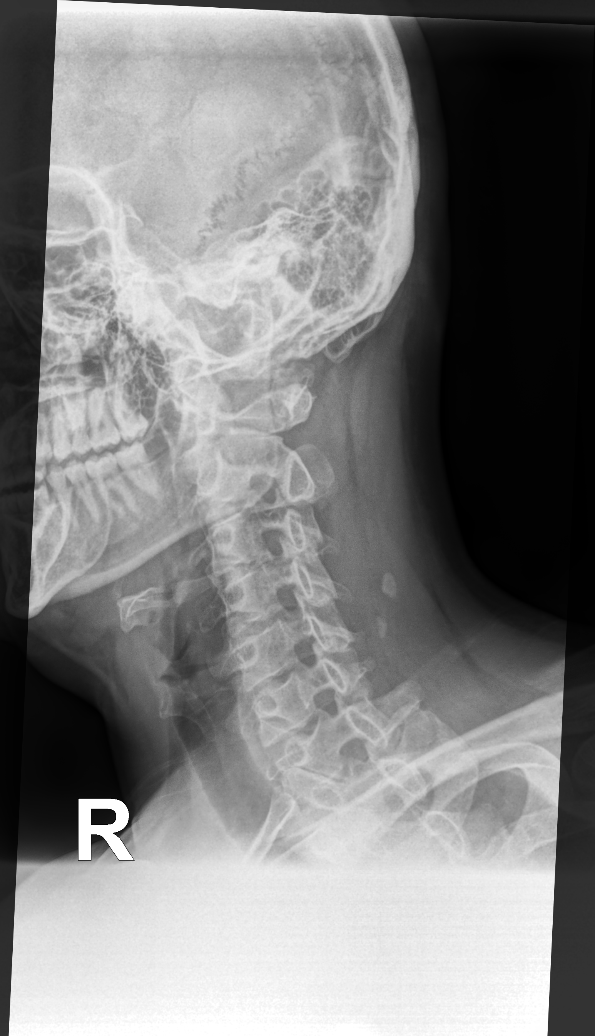

[cervical spine ap]
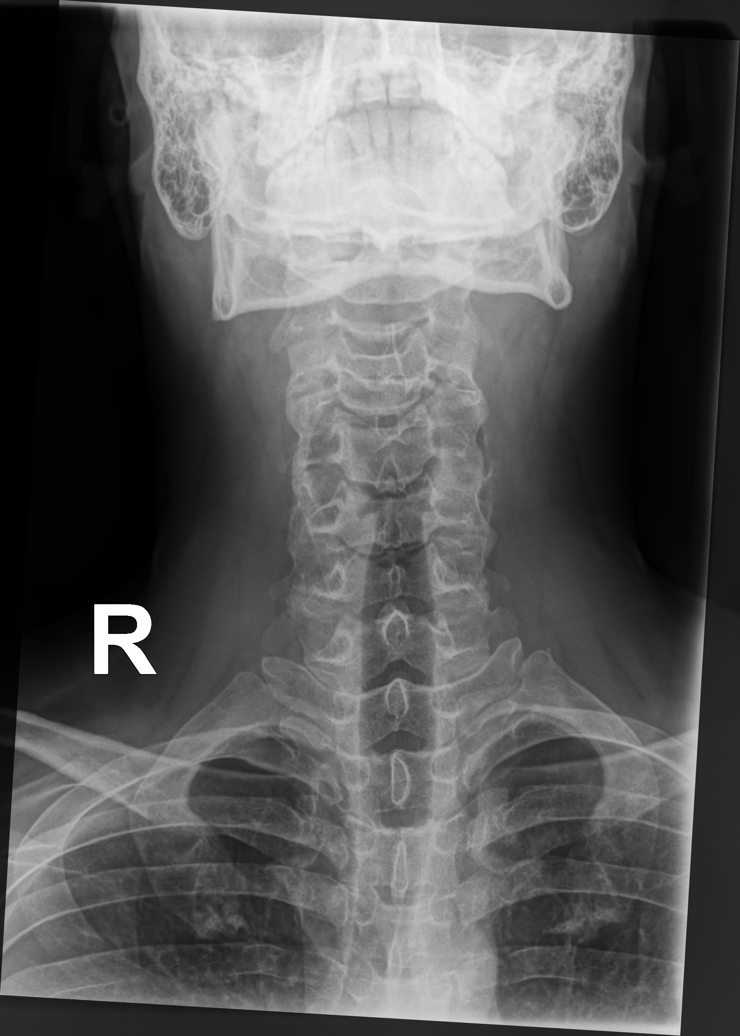

[cervical spine open mouth ap]
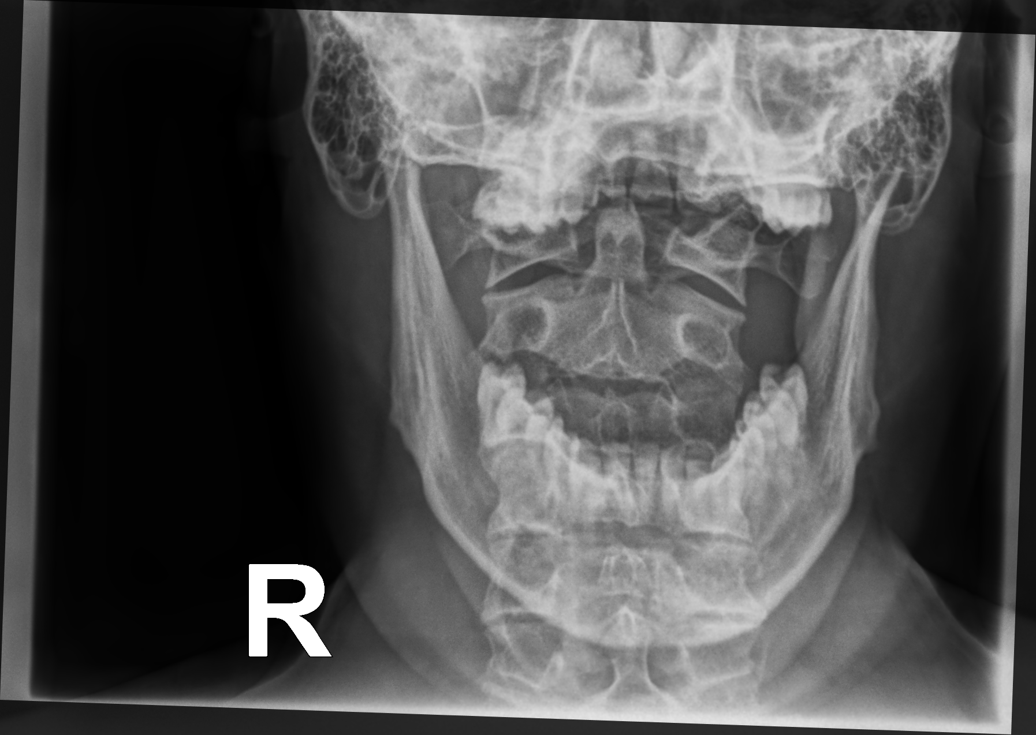

[5 of 5 positions shown; findings below may reference images not displayed]

FINDINGS: Normal alignment. No fracture. Prevertebral soft tissues are normal.
Disc spaces maintained. Soft tissue calcifications posteriorly
likely reflect changes from remote soft tissue injury.
IMPRESSION: No acute bony abnormality.

## 2021-09-11 ENCOUNTER — Other Ambulatory Visit: Payer: Self-pay | Admitting: Internal Medicine

## 2021-09-11 DIAGNOSIS — F322 Major depressive disorder, single episode, severe without psychotic features: Secondary | ICD-10-CM

## 2021-09-14 ENCOUNTER — Other Ambulatory Visit: Payer: Self-pay | Admitting: Internal Medicine

## 2021-09-14 DIAGNOSIS — I10 Essential (primary) hypertension: Secondary | ICD-10-CM

## 2021-10-30 ENCOUNTER — Other Ambulatory Visit: Payer: Self-pay | Admitting: Internal Medicine

## 2021-10-30 DIAGNOSIS — F322 Major depressive disorder, single episode, severe without psychotic features: Secondary | ICD-10-CM

## 2021-12-17 ENCOUNTER — Other Ambulatory Visit: Payer: Self-pay | Admitting: Internal Medicine

## 2021-12-17 DIAGNOSIS — I10 Essential (primary) hypertension: Secondary | ICD-10-CM

## 2022-01-20 ENCOUNTER — Other Ambulatory Visit: Payer: Self-pay | Admitting: Internal Medicine

## 2022-01-20 DIAGNOSIS — I10 Essential (primary) hypertension: Secondary | ICD-10-CM

## 2022-02-03 ENCOUNTER — Other Ambulatory Visit: Payer: Self-pay | Admitting: Internal Medicine

## 2022-02-03 DIAGNOSIS — I10 Essential (primary) hypertension: Secondary | ICD-10-CM

## 2022-03-19 ENCOUNTER — Encounter: Payer: Self-pay | Admitting: Internal Medicine

## 2022-03-19 ENCOUNTER — Ambulatory Visit: Payer: BC Managed Care – PPO | Admitting: Internal Medicine

## 2022-03-19 VITALS — BP 144/88 | HR 92 | Temp 98.7°F | Resp 16 | Ht 70.0 in | Wt 222.0 lb

## 2022-03-19 DIAGNOSIS — I10 Essential (primary) hypertension: Secondary | ICD-10-CM | POA: Diagnosis not present

## 2022-03-19 DIAGNOSIS — F322 Major depressive disorder, single episode, severe without psychotic features: Secondary | ICD-10-CM

## 2022-03-19 DIAGNOSIS — E785 Hyperlipidemia, unspecified: Secondary | ICD-10-CM

## 2022-03-19 DIAGNOSIS — E781 Pure hyperglyceridemia: Secondary | ICD-10-CM | POA: Diagnosis not present

## 2022-03-19 DIAGNOSIS — Z0000 Encounter for general adult medical examination without abnormal findings: Secondary | ICD-10-CM | POA: Diagnosis not present

## 2022-03-19 LAB — HEPATIC FUNCTION PANEL
ALT: 80 U/L — ABNORMAL HIGH (ref 0–53)
AST: 35 U/L (ref 0–37)
Albumin: 4.5 g/dL (ref 3.5–5.2)
Alkaline Phosphatase: 80 U/L (ref 39–117)
Bilirubin, Direct: 0.1 mg/dL (ref 0.0–0.3)
Total Bilirubin: 0.6 mg/dL (ref 0.2–1.2)
Total Protein: 7.8 g/dL (ref 6.0–8.3)

## 2022-03-19 LAB — CBC WITH DIFFERENTIAL/PLATELET
Basophils Absolute: 0 10*3/uL (ref 0.0–0.1)
Basophils Relative: 1 % (ref 0.0–3.0)
Eosinophils Absolute: 0 10*3/uL (ref 0.0–0.7)
Eosinophils Relative: 0.9 % (ref 0.0–5.0)
HCT: 43.8 % (ref 39.0–52.0)
Hemoglobin: 15.1 g/dL (ref 13.0–17.0)
Lymphocytes Relative: 25.2 % (ref 12.0–46.0)
Lymphs Abs: 1.2 10*3/uL (ref 0.7–4.0)
MCHC: 34.5 g/dL (ref 30.0–36.0)
MCV: 89.8 fl (ref 78.0–100.0)
Monocytes Absolute: 0.9 10*3/uL (ref 0.1–1.0)
Monocytes Relative: 19.3 % — ABNORMAL HIGH (ref 3.0–12.0)
Neutro Abs: 2.5 10*3/uL (ref 1.4–7.7)
Neutrophils Relative %: 53.6 % (ref 43.0–77.0)
Platelets: 165 10*3/uL (ref 150.0–400.0)
RBC: 4.88 Mil/uL (ref 4.22–5.81)
RDW: 12.9 % (ref 11.5–15.5)
WBC: 4.6 10*3/uL (ref 4.0–10.5)

## 2022-03-19 LAB — LIPID PANEL
Cholesterol: 164 mg/dL (ref 0–200)
HDL: 37.5 mg/dL — ABNORMAL LOW (ref >39.00)
NonHDL: 126.81
Total CHOL/HDL Ratio: 4
Triglycerides: 208 mg/dL — ABNORMAL HIGH (ref 0.0–149.0)
VLDL: 41.6 mg/dL — ABNORMAL HIGH (ref 0.0–40.0)

## 2022-03-19 LAB — BASIC METABOLIC PANEL
BUN: 20 mg/dL (ref 6–23)
CO2: 26 mEq/L (ref 19–32)
Calcium: 9.4 mg/dL (ref 8.4–10.5)
Chloride: 103 mEq/L (ref 96–112)
Creatinine, Ser: 0.88 mg/dL (ref 0.40–1.50)
GFR: 109.93 mL/min (ref >60.00)
Glucose, Bld: 91 mg/dL (ref 70–99)
Potassium: 3.9 mEq/L (ref 3.5–5.1)
Sodium: 137 mEq/L (ref 135–145)

## 2022-03-19 LAB — LDL CHOLESTEROL, DIRECT: Direct LDL: 102 mg/dL

## 2022-03-19 LAB — TSH: TSH: 1.54 u[IU]/mL (ref 0.35–5.50)

## 2022-03-19 NOTE — Progress Notes (Unsigned)
Subjective:  Patient ID: Noah Jones, male    DOB: 02/28/1986  Age: 36 y.o. MRN: [id number]  CC: Annual Exam, Hypertension, and Hyperlipidemia   HPI Noah Jones presents for a CPX and f/up -  He wants to stay on the current doses of abilify and viibryd.  Outpatient Medications Prior to Visit  Medication Sig Dispense Refill   ARIPiprazole (ABILIFY) 2 MG tablet TAKE 1 TABLET BY MOUTH EVERY DAY 90 tablet 1   benzonatate (TESSALON) 200 MG capsule Take 1 capsule (200 mg total) by mouth 2 (two) times daily as needed for cough. 20 capsule 0   Cholecalciferol (VITAMIN D3) 1.25 MG (50000 UT) CAPS TAKE 1 CAPSULE BY MOUTH ONE TIME PER WEEK 12 capsule 0   fexofenadine (ALLEGRA) 30 MG tablet Take 30 mg by mouth 2 (two) times daily.     losartan (COZAAR) 100 MG tablet TAKE 1 TABLET BY MOUTH EVERY DAY 90 tablet 0   omega-3 acid ethyl esters (LOVAZA) 1 g capsule TAKE 2 CAPSULES BY MOUTH 2 TIMES DAILY. 360 capsule 1   Vilazodone HCl (VIIBRYD) 10 MG TABS TAKE 1 TABLET BY MOUTH EVERY DAY 90 tablet 1   No facility-administered medications prior to visit.    ROS Review of Systems  Constitutional:  Positive for unexpected weight change (wt gain).    Objective:  BP (!) 144/88 (BP Location: Left Arm, Patient Position: Sitting, Cuff Size: Large)   Pulse 92   Temp 98.7 F (37.1 C) (Oral)   Resp 16   Ht 5\' 10"  (1.778 m)   Wt 222 lb (100.7 kg)   SpO2 97%   BMI 31.85 kg/m   BP Readings from Last 3 Encounters:  03/19/22 (!) 144/88  08/29/20 136/76  01/05/20 130/70    Wt Readings from Last 3 Encounters:  03/19/22 222 lb (100.7 kg)  01/25/21 194 lb (88 kg)  08/29/20 198 lb (89.8 kg)    Physical Exam  Lab Results  Component Value Date   WBC 5.5 08/29/2020   HGB 15.6 08/29/2020   HCT 45.2 08/29/2020   PLT 195.0 08/29/2020   GLUCOSE 117 (H) 08/29/2020   CHOL 179 08/29/2020   TRIG (H) 08/29/2020    457.0 Triglyceride is over 400; calculations on Lipids are invalid.   HDL 35.50  (L) 08/29/2020   LDLDIRECT 108.0 08/29/2020   LDLCALC 117 (H) 11/27/2017   ALT 26 08/29/2020   AST 17 08/29/2020   NA 138 08/29/2020   K 4.0 08/29/2020   CL 104 08/29/2020   CREATININE 0.85 08/29/2020   BUN 26 (H) 08/29/2020   CO2 27 08/29/2020   TSH 0.88 08/29/2020   HGBA1C 5.2 10/26/2019    MR CERVICAL SPINE W WO CONTRAST  Result Date: 11/11/2019 [facility] NEUROIMAGING REPORT STUDY DATE: 11/09/19 PATIENT NAME: Noah Jones DOB: March 12, 1986 MRN: 10/24/1985 ORDERING CLINICIAN: [id number], MD CLINICAL HISTORY: 36 year old male with numbness. EXAM: MR CERVICAL SPINE W WO CONTRAST TECHNIQUE: MRI of the cervical spine was obtained utilizing 3 mm sagittal slices from the posterior fossa down to the T3-4 level with T1, T2 and inversion recovery views. In addition 4 mm axial slices from C2-3 down to T1-2 level were included with T2 and gradient echo views. CONTRAST: 40ml multihance COMPARISON: none IMAGING SITE: [facility] [address] (1.5 Tesla MRI)  FINDINGS: On sagittal views the vertebral bodies have normal height and alignment. Minimal disc bulging at C5-6. The spinal cord is normal in size and appearance.  The posterior fossa, pituitary gland and paraspinal soft tissues are unremarkable.  On axial views there is no spinal stenosis or foraminal narrowing. Limited views of the soft tissues of the head and neck are unremarkable.   Normal MRI cervical spine (with and without). INTERPRETING PHYSICIAN: Penni Bombard, MD Certified in Neurology, Neurophysiology and Neuroimaging [facility] [address], [place], [place] [address] [phone]   MR BRAIN W WO CONTRAST  Result Date: 11/11/2019 [facility] NEUROIMAG[facility] REPORT STUDY DATE: 11/09/19 PATIENT NAME: Noah Jones DOB: 20-Nov-1985 MRN: [id number] ORDERING CLINICIAN: Penni Bombard, MD CLINICAL HISTORY: 36 year old male with numbness. EXAM: MR BRAIN  W WO CONTRAST TECHNIQUE: MRI of the brain with and without contrast was obtained utilizing 5 mm axial slices with T1, T2, T2 flair, SWI and diffusion weighted views.  T1 sagittal, T2 coronal and postcontrast views in the axial and coronal plane were obtained. CONTRAST: 83ml multihance COMPARISON: none IMAGING SITE: [facility] [address] (1.5 Tesla MRI)  FINDINGS: No abnormal lesions are seen on diffusion-weighted views to suggest acute ischemia. The cortical sulci, fissures and cisterns are normal in size and appearance. Lateral, third and fourth ventricle are normal in size and appearance. No extra-axial fluid collections are seen. No evidence of mass effect or midline shift.  No abnormal lesions are seen on post contrast views.  On sagittal views the posterior fossa, pituitary gland and corpus callosum are unremarkable. No evidence of intracranial hemorrhage on SWI views. The orbits and their contents, paranasal sinuses and calvarium are unremarkable.  Intracranial flow voids are present.   Normal MRI brain (with and without). INTERPRETING PHYSICIAN: Penni Bombard, MD Certified in Neurology, Neurophysiology and Neuroimaging [facility] [address] [place], [place] [address] [phone]    Assessment & Plan:   Noah Jones was seen today for annual exam, hypertension and hyperlipidemia.  Diagnoses and all orders for this visit:  Primary hypertension -     Cancel: Basic metabolic panel; Future -     Cancel: CBC with Differential/Platelet; Future -     TSH; Future -     Hepatic function panel; Future -     Basic metabolic panel; Future -     CBC with Differential/Platelet; Future  Routine general medical examination at a health care facility  Hyperlipidemia, unspecified hyperlipidemia type -     Lipid panel; Future -     TSH; Future -     Hepatic function panel; Future  Pure hypertriglyceridemia -     Lipid panel; Future  Current severe  episode of major depressive disorder without psychotic features without prior episode ([place]) -     Cancel: Basic metabolic panel; Future -     Cancel: CBC with Differential/Platelet; Future -     TSH; Future -     Hepatic function panel; Future   I am having Noah Jones maintain his fexofenadine, benzonatate, Vitamin D3, omega-3 acid ethyl esters, ARIPiprazole, losartan, and Vilazodone HCl.  No orders of the defined types were placed in this encounter.    Follow-up: No follow-ups on file.  Scarlette Calico, MD

## 2022-03-19 NOTE — Patient Instructions (Signed)
Health Maintenance, Male Adopting a healthy lifestyle and getting preventive care are important in promoting health and wellness. Ask your health care provider about: The right schedule for you to have regular tests and exams. Things you can do on your own to prevent diseases and keep yourself healthy. What should I know about diet, weight, and exercise? Eat a healthy diet  Eat a diet that includes plenty of vegetables, fruits, low-fat dairy products, and lean protein. Do not eat a lot of foods that are high in solid fats, added sugars, or sodium. Maintain a healthy weight Body mass index (BMI) is a measurement that can be used to identify possible weight problems. It estimates body fat based on height and weight. Your health care provider can help determine your BMI and help you achieve or maintain a healthy weight. Get regular exercise Get regular exercise. This is one of the most important things you can do for your health. Most adults should: Exercise for at least 150 minutes each week. The exercise should increase your heart rate and make you sweat (moderate-intensity exercise). Do strengthening exercises at least twice a week. This is in addition to the moderate-intensity exercise. Spend less time sitting. Even light physical activity can be beneficial. Watch cholesterol and blood lipids Have your blood tested for lipids and cholesterol at 37 years of age, then have this test every 5 years. You may need to have your cholesterol levels checked more often if: Your lipid or cholesterol levels are high. You are older than 37 years of age. You are at high risk for heart disease. What should I know about cancer screening? Many types of cancers can be detected early and may often be prevented. Depending on your health history and family history, you may need to have cancer screening at various ages. This may include screening for: Colorectal cancer. Prostate cancer. Skin cancer. Lung  cancer. What should I know about heart disease, diabetes, and high blood pressure? Blood pressure and heart disease High blood pressure causes heart disease and increases the risk of stroke. This is more likely to develop in people who have high blood pressure readings or are overweight. Talk with your health care provider about your target blood pressure readings. Have your blood pressure checked: Every 3-5 years if you are 18-39 years of age. Every year if you are 40 years old or older. If you are between the ages of 65 and 75 and are a current or former smoker, ask your health care provider if you should have a one-time screening for abdominal aortic aneurysm (AAA). Diabetes Have regular diabetes screenings. This checks your fasting blood sugar level. Have the screening done: Once every three years after age 45 if you are at a normal weight and have a low risk for diabetes. More often and at a younger age if you are overweight or have a high risk for diabetes. What should I know about preventing infection? Hepatitis B If you have a higher risk for hepatitis B, you should be screened for this virus. Talk with your health care provider to find out if you are at risk for hepatitis B infection. Hepatitis C Blood testing is recommended for: Everyone born from 1945 through 1965. Anyone with known risk factors for hepatitis C. Sexually transmitted infections (STIs) You should be screened each year for STIs, including gonorrhea and chlamydia, if: You are sexually active and are younger than 37 years of age. You are older than 37 years of age and your   health care provider tells you that you are at risk for this type of infection. Your sexual activity has changed since you were last screened, and you are at increased risk for chlamydia or gonorrhea. Ask your health care provider if you are at risk. Ask your health care provider about whether you are at high risk for HIV. Your health care provider  may recommend a prescription medicine to help prevent HIV infection. If you choose to take medicine to prevent HIV, you should first get tested for HIV. You should then be tested every 3 months for as long as you are taking the medicine. Follow these instructions at home: Alcohol use Do not drink alcohol if your health care provider tells you not to drink. If you drink alcohol: Limit how much you have to 0-2 drinks a day. Know how much alcohol is in your drink. In the [place] one drink equals one 12 oz bottle of beer (355 mL), one 5 oz glass of wine (148 mL), or one 1 oz glass of hard liquor (44 mL). Lifestyle Do not use any products that contain nicotine or tobacco. These products include cigarettes, chewing tobacco, and vaping devices, such as e-cigarettes. If you need help quitting, ask your health care provider. Do not use street drugs. Do not share needles. Ask your health care provider for help if you need support or information about quitting drugs. General instructions Schedule regular health, dental, and eye exams. Stay current with your vaccines. Tell your health care provider if: You often feel depressed. You have ever been abused or do not feel safe at home. Summary Adopting a healthy lifestyle and getting preventive care are important in promoting health and wellness. Follow your health care provider's instructions about healthy diet, exercising, and getting tested or screened for diseases. Follow your health care provider's instructions on monitoring your cholesterol and blood pressure. This information is not intended to replace advice given to you by your health care provider. Make sure you discuss any questions you have with your health care provider. Document Revised: 10/09/2020 Document Reviewed: 10/09/2020 Elsevier Patient Education  2023 [employer]  

## 2022-03-20 MED ORDER — LOSARTAN POTASSIUM 100 MG PO TABS: 100.0000 mg | ORAL_TABLET | Freq: Every day | ORAL | 1 refills | Status: DC

## 2022-04-05 ENCOUNTER — Ambulatory Visit (INDEPENDENT_AMBULATORY_CARE_PROVIDER_SITE_OTHER): Payer: BC Managed Care – PPO | Admitting: Psychology

## 2022-04-05 DIAGNOSIS — F330 Major depressive disorder, recurrent, mild: Secondary | ICD-10-CM | POA: Diagnosis not present
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
## 2022-04-17 ENCOUNTER — Ambulatory Visit: Payer: BC Managed Care – PPO | Admitting: Psychology

## 2022-04-17 DIAGNOSIS — F330 Major depressive disorder, recurrent, mild: Secondary | ICD-10-CM
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
## 2022-04-30 ENCOUNTER — Other Ambulatory Visit: Payer: Self-pay | Admitting: Internal Medicine

## 2022-04-30 DIAGNOSIS — F322 Major depressive disorder, single episode, severe without psychotic features: Secondary | ICD-10-CM

## 2022-05-10 ENCOUNTER — Ambulatory Visit (INDEPENDENT_AMBULATORY_CARE_PROVIDER_SITE_OTHER): Payer: BC Managed Care – PPO | Admitting: Psychology

## 2022-05-10 DIAGNOSIS — F330 Major depressive disorder, recurrent, mild: Secondary | ICD-10-CM

## 2022-05-10 NOTE — Progress Notes (Signed)
[facility] Counselor/Therapist Progress Note  Patient ID: Noah Jones, MRN: [id number],    Date: 05/10/2022  Time Spent: 45 mins  Treatment Type: Individual Therapy  Reported Symptoms: Pt presents for the session via WebEx video.  Pt grants consent for the session, stating he is in his home with no one else present.  I shared with pt that I am in my office with no one else here either.  Mental Status Exam: Appearance:  Casual     Behavior: Appropriate  Motor: Normal  Speech/Language:  Clear and Coherent  Affect: Appropriate  Mood: normal  Thought process: normal  Thought content:   WNL  Sensory/Perceptual disturbances:   WNL  Orientation: oriented to person, place, and time/date  Attention: Good  Concentration: Good  Memory: WNL  Fund of knowledge:  Good  Insight:   Good  Judgment:  Good  Impulse Control: Good   Risk Assessment: Danger to Self:  No Self-injurious Behavior: No Danger to Others: No Duty to Warn:no Physical Aggression / Violence:No  Access to Firearms a concern: No  Gang Involvement:No   Subjective: Pt shares he has been good since our last session.  Pt shares that he had a good thing happen at work; he won a Engineer, [employer] for participating in a project.  On a personal note, he has asked his cousins to participate with his D&D game; one told him he could not right now, but might later.  That was the same cousin who wanted to take a break from him previously so pt believes that relationship may still be intact.  Pt was on vacation last week and returned this week to work.  Pt went to [place] to see his parents for Thanksgiving; his parents were doing well and he enjoyed the visit.  They played various board games while he was there.  Pt shares that he continues to sleep pretty well since our last session.  Pt shares he has had a good week at work this past week; it was nice for pt to see that he is needed at work.  Pt has ordered a new board game  that will be coming tomorrow and he is excited to begin going through that game.  Pt shares he has not considered reaching out to [name]; "he made it clear to [place] that he wanted nothing to do with me or my parents.  I have lots of feelings about that (sadness, anger, frustration) but I also know that this is his choice."  Pt shares that his parents are interested in playing D&D with him in person in April when they come to visit him; they will likely want to look for homes when they are here for their retirement.  Pt shares that he and his family is [place] and he has family in [place] who are currently safe.  We talked briefly about pt's thoughts/feelings about the war in [place].  Pt shares he has been taking walks as a means of self care as well as playing board games.  Encouraged him to continue with his self care activities and we will meet in 2 wks for a follow up session.    Interventions: Cognitive Behavioral Therapy  Diagnosis:Mild episode of recurrent major depressive disorder ([facility])  Plan: Treatment Plan Strengths/Abilities:  Intelligent, Intuitive, Willing to participate in therapy Treatment Preferences:  Outpatient Individual Therapy Statement of Needs:  Patient is to use CBT, mindfulness and coping skills to help manage and/or decrease symptoms associated with their diagnosis.  Symptoms:  Depressed/Irritable mood, worry, social withdrawal Problems Addressed:  Depressive thoughts, Sadness, Sleep issues, etc. Long Term Goals:  Pt to reduce overall level, frequency, and intensity of the feelings of depression as evidenced by decreased irritability, negative self talk, and helpless feelings from 6 to 7 days/week to 0 to 1 days/week, per client report, for at least 3 consecutive months.  Progress: 10% Short Term Goals:  Pt to verbally express understanding of the relationship between feelings of depression and their impact on thinking patterns and behaviors.  Pt to verbalize an understanding of the role  that distorted thinking plays in creating fears, excessive worry, and ruminations.  Progress: 10% Target Date:  04/18/2023 Frequency:  Bi-weekly Modality:  Cognitive Behavioral Therapy Interventions by Therapist:  Therapist will use CBT, Mindfulness exercises, Coping skills and Referrals, as needed by client. Client has verbally approved this treatment plan.  Karie Kirks, [facility]

## 2022-05-24 ENCOUNTER — Ambulatory Visit (INDEPENDENT_AMBULATORY_CARE_PROVIDER_SITE_OTHER): Payer: BC Managed Care – PPO | Admitting: Psychology

## 2022-05-24 DIAGNOSIS — F330 Major depressive disorder, recurrent, mild: Secondary | ICD-10-CM | POA: Diagnosis not present

## 2022-05-24 NOTE — Progress Notes (Signed)
[place] Counselor/Therapist Progress Note  Patient ID: Noah Jones, MRN: [id number],    Date: 05/24/2022  Time Spent: 45 mins  Treatment Type: Individual Therapy  Reported Symptoms: Pt presents for the session via WebEx video.  Pt grants consent for the session, stating he is in his home with no one else present.  I shared with pt that I am in my office with no one else here either.  Mental Status Exam: Appearance:  Casual     Behavior: Appropriate  Motor: Normal  Speech/Language:  Clear and Coherent  Affect: Appropriate  Mood: normal  Thought process: normal  Thought content:   WNL  Sensory/Perceptual disturbances:   WNL  Orientation: oriented to person, place, and time/date  Attention: Good  Concentration: Good  Memory: WNL  Fund of knowledge:  Good  Insight:   Good  Judgment:  Good  Impulse Control: Good   Risk Assessment: Danger to Self:  No Self-injurious Behavior: No Danger to Others: No Duty to Warn:no Physical Aggression / Violence:No  Access to Firearms a concern: No  Gang Involvement:No   Subjective: Pt shares he has been OK since our last session.  Pt shares that he received holiday cards from two of his cousins who he thought did not want to interact with pt anymore.  Pt feels good about that situation.  He plans to text them a thank you for the cards.  Pt shares that his uncle (widow) has met a new person and is planning to get married again.  Pt shares that he might have to go into work to make some parts this weekend so he will not be going to [place] to see his parents.  Pt shares that work is going pretty well.  Pt continues to use gaming, playing board games as means of self care.  Suggested pt consider not closing the door on his relationship with Quita Skye and [name], regardless of what Quita Skye and [name] have chosen to do.  Pt shares that he will continue to think about this idea and let me know what he thinks of it at our follow up session.  Pt  shares he continues to walk regularly in his neighborhood, at least 3 times per week.  He is planning to meet a friend next weekend for lunch and is looking forward to that.  He plans to Skype his parents tomorrow to chat with them and play a board game together.  Encouraged him to continue with his self care activities and we will meet in 2 wks for a follow up session.    Interventions: Cognitive Behavioral Therapy  Diagnosis:Mild episode of recurrent major depressive disorder ([place])  Plan: Treatment Plan Strengths/Abilities:  Intelligent, Intuitive, Willing to participate in therapy Treatment Preferences:  Outpatient Individual Therapy Statement of Needs:  Patient is to use CBT, mindfulness and coping skills to help manage and/or decrease symptoms associated with their diagnosis. Symptoms:  Depressed/Irritable mood, worry, social withdrawal Problems Addressed:  Depressive thoughts, Sadness, Sleep issues, etc. Long Term Goals:  Pt to reduce overall level, frequency, and intensity of the feelings of depression as evidenced by decreased irritability, negative self talk, and helpless feelings from 6 to 7 days/week to 0 to 1 days/week, per client report, for at least 3 consecutive months.  Progress: 10% Short Term Goals:  Pt to verbally express understanding of the relationship between feelings of depression and their impact on thinking patterns and behaviors.  Pt to verbalize an understanding of the role that distorted  thinking plays in creating fears, excessive worry, and ruminations.  Progress: 10% Target Date:  04/18/2023 Frequency:  Bi-weekly Modality:  Cognitive Behavioral Therapy Interventions by Therapist:  Therapist will use CBT, Mindfulness exercises, Coping skills and Referrals, as needed by client. Client has verbally approved this treatment plan.  [name], [facility]

## 2022-06-01 ENCOUNTER — Other Ambulatory Visit: Payer: Self-pay | Admitting: Internal Medicine

## 2022-06-01 DIAGNOSIS — F322 Major depressive disorder, single episode, severe without psychotic features: Secondary | ICD-10-CM

## 2022-06-07 ENCOUNTER — Ambulatory Visit (INDEPENDENT_AMBULATORY_CARE_PROVIDER_SITE_OTHER): Payer: BC Managed Care – PPO | Admitting: Psychology

## 2022-06-07 DIAGNOSIS — F330 Major depressive disorder, recurrent, mild: Secondary | ICD-10-CM

## 2022-06-07 NOTE — Progress Notes (Signed)
[place] Counselor/Therapist Progress Note  Patient ID: Noah Jones, MRN: [id number],    Date: 06/07/2022  Time Spent: 30 mins  Treatment Type: Individual Therapy  Reported Symptoms: Pt presents for the session via WebEx video.  Pt grants consent for the session, stating he is in his home with no one else present.  I shared with pt that I am in my office with no one else here either.  Mental Status Exam: Appearance:  Casual     Behavior: Appropriate  Motor: Normal  Speech/Language:  Clear and Coherent  Affect: Appropriate  Mood: normal  Thought process: normal  Thought content:   WNL  Sensory/Perceptual disturbances:   WNL  Orientation: oriented to person, place, and time/date  Attention: Good  Concentration: Good  Memory: WNL  Fund of knowledge:  Good  Insight:   Good  Judgment:  Good  Impulse Control: Good   Risk Assessment: Danger to Self:  No Self-injurious Behavior: No Danger to Others: No Duty to Warn:no Physical Aggression / Violence:No  Access to Firearms a concern: No  Gang Involvement:No   Subjective: Pt shares he has been "mostly good since our last session.  Most of the stress was work related."  Pt shares that he enjoyed his gaming session with his parents over Christmas weekend.  New Year's weekend he had a "World Building" session with his D&D group and enjoyed working with them.  He also spent another day last weekend working to learn another new game and that was fun for him too.  Pt shares he continues to walk regularly in his neighborhood, when weather permits.  Pt shares that he is in a good emotional space right now; he believes that he has adjusted well to the decisions made by Quita Skye and [name].  He is doing pretty well at work right now; enjoys the team he is part of there.  Pt shares he did meet a friend for lunch last Friday and enjoyed that time.  Talked more with pt about the option to keep the door cracked open on his relationship  with Quita Skye and [name].  Pt shares he did think about this issue and he is willing to keep the option open for his relationship with Quita Skye.  He is less inclined to do so for [place].  He understands that she is trying to support her husband but he does not like her trying to hurt him while supporting Quita Skye.  Pt shares that Feb tends to be a tough month for him because he lost 3 of his grandparents and a cousin over the years in that month.  Encouraged him to continue with his self care activities and we will meet in 3 wks for a follow up session.    Interventions: Cognitive Behavioral Therapy  Diagnosis:Mild episode of recurrent major depressive disorder ([place])  Plan: Treatment Plan Strengths/Abilities:  Intelligent, Intuitive, Willing to participate in therapy Treatment Preferences:  Outpatient Individual Therapy Statement of Needs:  Patient is to use CBT, mindfulness and coping skills to help manage and/or decrease symptoms associated with their diagnosis. Symptoms:  Depressed/Irritable mood, worry, social withdrawal Problems Addressed:  Depressive thoughts, Sadness, Sleep issues, etc. Long Term Goals:  Pt to reduce overall level, frequency, and intensity of the feelings of depression as evidenced by decreased irritability, negative self talk, and helpless feelings from 6 to 7 days/week to 0 to 1 days/week, per client report, for at least 3 consecutive months.  Progress: 10% Short Term Goals:  Pt to  verbally express understanding of the relationship between feelings of depression and their impact on thinking patterns and behaviors.  Pt to verbalize an understanding of the role that distorted thinking plays in creating fears, excessive worry, and ruminations.  Progress: 10% Target Date:  04/18/2023 Frequency:  Bi-weekly Modality:  Cognitive Behavioral Therapy Interventions by Therapist:  Therapist will use CBT, Mindfulness exercises, Coping skills and Referrals, as needed by client. Client has verbally  approved this treatment plan.  [name], [facility]

## 2022-06-28 ENCOUNTER — Ambulatory Visit (INDEPENDENT_AMBULATORY_CARE_PROVIDER_SITE_OTHER): Payer: BC Managed Care – PPO | Admitting: Psychology

## 2022-06-28 DIAGNOSIS — F330 Major depressive disorder, recurrent, mild: Secondary | ICD-10-CM | POA: Diagnosis not present

## 2022-06-28 NOTE — Progress Notes (Signed)
[place] Counselor/Therapist Progress Note  Patient ID: Noah Jones, MRN: [id number],    Date: 06/28/2022  Time Spent: 30 mins  Treatment Type: Individual Therapy  Reported Symptoms: Pt presents for the session via WebEx video.  Pt grants consent for the session, stating he is in his home with no one else present.  I shared with pt that I am in my office with no one else here either.  Mental Status Exam: Appearance:  Casual     Behavior: Appropriate  Motor: Normal  Speech/Language:  Clear and Coherent  Affect: Appropriate  Mood: normal  Thought process: normal  Thought content:   WNL  Sensory/Perceptual disturbances:   WNL  Orientation: oriented to person, place, and time/date  Attention: Good  Concentration: Good  Memory: WNL  Fund of knowledge:  Good  Insight:   Good  Judgment:  Good  Impulse Control: Good   Risk Assessment: Danger to Self:  No Self-injurious Behavior: No Danger to Others: No Duty to Warn:no Physical Aggression / Violence:No  Access to Firearms a concern: No  Gang Involvement:No   Subjective: Pt shares he has been "doing pretty good.  I have gotten a bonus check for the good work we did last quarter and that was good.  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  His day to day work has been going well; he now has help with the planning and scheduling of new projects so he can focus more on the day to day operations in his area.  Pt shares that his parents are doing well; his mom's birthday was last week.  Pt shares he has been doing his D&D game sessions again, video games, and working on board games as well.  He continues to work on Immunologist his new board game and feels like he is beginning to understand it more now.  Pt also shares that he did feel a little depressed last week when he wished his cousin's wife a happy birthday and he was  able to see some photos of his niece [name]) online and he was sad not to be part of her life at this time.  She will be [age] next month.  He would like to be able to send her a gift for her birthday; encouraged pt to talk with his parents about this possibility.  Pt shares that he has been sleeping fine this week.  Encouraged pt to think about his grandparents who passed away in 2022/07/16 and try to remember times with them that benefited pt; what can he look back on fondly?  Pt shares he is still not sure how to interact with his cousins and aunts and uncles, in light of the communication from [place].  Encouraged pt to consider continuing to interact with them as though nothing had happened.  Asked pt to think about this between now and our follow up session.  Encouraged him to continue with his self care activities and we will meet in 2 wks for a follow up session.    Interventions: Cognitive Behavioral Therapy  Diagnosis:Mild episode of recurrent major depressive disorder ([place])  Plan: Treatment Plan Strengths/Abilities:  Intelligent, Intuitive, Willing to participate in therapy Treatment Preferences:  Outpatient Individual Therapy Statement of Needs:  Patient is to use CBT, mindfulness and coping skills to help manage and/or decrease symptoms associated with their diagnosis. Symptoms:  Depressed/Irritable mood, worry, social withdrawal Problems Addressed:  Depressive thoughts, Sadness, Sleep issues, etc. Long Term Goals:  Pt to reduce overall level, frequency, and intensity of the feelings of depression as evidenced by decreased irritability, negative self talk, and helpless feelings from 6 to 7 days/week to 0 to 1 days/week, per client report, for at least 3 consecutive months.  Progress: 10% Short Term Goals:  Pt to verbally express understanding of the relationship between feelings of depression and their impact on thinking patterns and behaviors.  Pt to verbalize an understanding of the role  that distorted thinking plays in creating fears, excessive worry, and ruminations.  Progress: 10% Target Date:  04/18/2023 Frequency:  Bi-weekly Modality:  Cognitive Behavioral Therapy Interventions by Therapist:  Therapist will use CBT, Mindfulness exercises, Coping skills and Referrals, as needed by client. Client has verbally approved this treatment plan.  [name], [facility]

## 2022-07-11 ENCOUNTER — Ambulatory Visit (INDEPENDENT_AMBULATORY_CARE_PROVIDER_SITE_OTHER): Payer: BC Managed Care – PPO | Admitting: Psychology

## 2022-07-11 DIAGNOSIS — F330 Major depressive disorder, recurrent, mild: Secondary | ICD-10-CM | POA: Diagnosis not present

## 2022-07-11 NOTE — Progress Notes (Addendum)
[place] Counselor/Therapist Progress Note  Patient ID: Noah Jones, MRN: [id number],    Date: 07/11/2022  Time Spent: 45 mins  Treatment Type: Individual Therapy  Reported Symptoms: Pt presents for the session via WebEx video.  Pt grants consent for the session, stating he is in his home with no one else present.  I shared with pt that I am in my office with no one else here either.  Mental Status Exam: Appearance:  Casual     Behavior: Appropriate  Motor: Normal  Speech/Language:  Clear and Coherent  Affect: Appropriate  Mood: normal  Thought process: normal  Thought content:   WNL  Sensory/Perceptual disturbances:   WNL  Orientation: oriented to person, place, and time/date  Attention: Good  Concentration: Good  Memory: WNL  Fund of knowledge:  Good  Insight:   Good  Judgment:  Good  Impulse Control: Good   Risk Assessment: Danger to Self:  No Self-injurious Behavior: No Danger to Others: No Duty to Warn:no Physical Aggression / Violence:No  Access to Firearms a concern: No  Gang Involvement:No   Subjective: Pt shares he has been "doing pretty good.  Things at work are getting busier and that is a good thing.  I may even have to work a couple of weekends to finish up some projects."   Pt shares he continues to run his D&D game; their next session is coming up this Saturday.  Pt appreciates the creative aspect of the game.  He also shares that he used to draw when he was younger and enjoyed that.  Pt shares that his parents are doing well; they are planning to move back to [place] when his dad retires at the end of 2024.  They are planning to come to [facility] in April for [place] and to look for homes for retirement.  Pt shares that there has been no change in the family drama with [name] and Quita Skye.  Pt shares that he has increased his walking lately, since the days are getting a little longer.  Pt shares his dept at work was recognized for being the most adaptive  dept in the company in terms of new production changes that the company is making.  Pt shares that he has not been sleeping great this past week because of the cough he has had.  He believes it will return to normal as he gets better.  Pt shares he generally eats pretty healthy; he likes chicken, either baked or grilled.  Pt shares he does have a history of HBP and is on medication for that.  He follows up with his PCP for that condition.  Encouraged pt to be intentional about reflecting on his grandparents and appreciating times he shared with them.  Encouraged him to continue with his self care activities and we will meet in 2 wks for a follow up session.    Interventions: Cognitive Behavioral Therapy  Diagnosis:Mild episode of recurrent major depressive disorder ([place])  Plan: Treatment Plan Strengths/Abilities:  Intelligent, Intuitive, Willing to participate in therapy Treatment Preferences:  Outpatient Individual Therapy Statement of Needs:  Patient is to use CBT, mindfulness and coping skills to help manage and/or decrease symptoms associated with their diagnosis. Symptoms:  Depressed/Irritable mood, worry, social withdrawal Problems Addressed:  Depressive thoughts, Sadness, Sleep issues, etc. Long Term Goals:  Pt to reduce overall level, frequency, and intensity of the feelings of depression as evidenced by decreased irritability, negative self talk, and helpless feelings from 6 to 7  days/week to 0 to 1 days/week, per client report, for at least 3 consecutive months.  Progress: 10% Short Term Goals:  Pt to verbally express understanding of the relationship between feelings of depression and their impact on thinking patterns and behaviors.  Pt to verbalize an understanding of the role that distorted thinking plays in creating fears, excessive worry, and ruminations.  Progress: 10% Target Date:  04/18/2023 Frequency:  Bi-weekly Modality:  Cognitive Behavioral Therapy Interventions by Therapist:   Therapist will use CBT, Mindfulness exercises, Coping skills and Referrals, as needed by client. Client has verbally approved this treatment plan.  [name], [facility]

## 2022-07-26 ENCOUNTER — Ambulatory Visit (INDEPENDENT_AMBULATORY_CARE_PROVIDER_SITE_OTHER): Payer: BC Managed Care – PPO | Admitting: Psychology

## 2022-07-26 DIAGNOSIS — F330 Major depressive disorder, recurrent, mild: Secondary | ICD-10-CM | POA: Diagnosis not present

## 2022-07-26 NOTE — Progress Notes (Signed)
[place] Counselor/Therapist Progress Note  Patient ID: Noah Jones, MRN: [id number],    Date: 07/26/2022  Time Spent: 45 mins  Treatment Type: Individual Therapy  Reported Symptoms: Pt presents for the session via WebEx video.  Pt grants consent for the session, stating he is in his home with no one else present.  I shared with pt that I am in my office with no one else here either.  Mental Status Exam: Appearance:  Casual     Behavior: Appropriate  Motor: Normal  Speech/Language:  Clear and Coherent  Affect: Appropriate  Mood: normal  Thought process: normal  Thought content:   WNL  Sensory/Perceptual disturbances:   WNL  Orientation: oriented to person, place, and time/date  Attention: Good  Concentration: Good  Memory: WNL  Fund of knowledge:  Good  Insight:   Good  Judgment:  Good  Impulse Control: Good   Risk Assessment: Danger to Self:  No Self-injurious Behavior: No Danger to Others: No Duty to Warn:no Physical Aggression / Violence:No  Access to Firearms a concern: No  Gang Involvement:No   Subjective: Pt shares he has been "doing OK."  Pt shares that his brother put on FB that his daughter, Noah Jones, has been diagnosed with Autism and he also noted that he wondered if he was on the spectrum and plans to be tested as well.  Noah Jones did not state in the post why he and Noah Jones decided to have Noah Jones tested but pt is happy that Noah Jones will be getting the support she needs.  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  Congratulated pt on this good performance.  Pt shares that he has been busy at work but he has been continuing his D&D game on Saturdays and that is going well.  Pt shares his parents are doing well; they are still planning to come for a visit and to start house  shopping in April.  Pt shares he has not had a chance to do any drawing but will continue to keep it on his list of possible self care activities.  Pt has been able to continue to go for walks on a regular basis when he has the opportunity.  Pt shares he does have a history of HBP and is on medication for that.  He follows up with his PCP for that condition.  Encouraged him to continue with his self care activities and we will meet in 2 wks for a follow up session.    Interventions: Cognitive Behavioral Therapy  Diagnosis:Mild episode of recurrent major depressive disorder (Noah Jones)  Plan: Treatment Plan Strengths/Abilities:  Intelligent, Intuitive, Willing to participate in therapy Treatment Preferences:  Outpatient Individual Therapy Statement of Needs:  Patient is to use CBT, mindfulness and coping skills to help manage and/or decrease symptoms associated with their diagnosis. Symptoms:  Depressed/Irritable mood, worry, social withdrawal Problems Addressed:  Depressive thoughts, Sadness, Sleep issues, etc. Long Term Goals:  Pt to reduce overall level, frequency, and intensity of the feelings of depression as evidenced by decreased irritability, negative self talk, and helpless feelings from 6 to 7 days/week to 0 to 1 days/week, per client report, for at least 3 consecutive months.  Progress: 10% Short Term Goals:  Pt to verbally express understanding of the relationship between feelings of  depression and their impact on thinking patterns and behaviors.  Pt to verbalize an understanding of the role that distorted thinking plays in creating fears, excessive worry, and ruminations.  Progress: 10% Target Date:  04/18/2023 Frequency:  Bi-weekly Modality:  Cognitive Behavioral Therapy Interventions by Therapist:  Therapist will use CBT, Mindfulness exercises, Coping skills and Referrals, as needed by client. Client has verbally approved this treatment plan.  Noah Jones, [facility]

## 2022-07-31 ENCOUNTER — Other Ambulatory Visit: Payer: Self-pay | Admitting: Internal Medicine

## 2022-07-31 DIAGNOSIS — I10 Essential (primary) hypertension: Secondary | ICD-10-CM

## 2022-08-09 ENCOUNTER — Ambulatory Visit (INDEPENDENT_AMBULATORY_CARE_PROVIDER_SITE_OTHER): Payer: BC Managed Care – PPO | Admitting: Psychology

## 2022-08-09 DIAGNOSIS — F330 Major depressive disorder, recurrent, mild: Secondary | ICD-10-CM | POA: Diagnosis not present

## 2022-08-09 NOTE — Progress Notes (Signed)
[place] Counselor/Therapist Progress Note  Patient ID: Noah Jones, MRN: [id number],    Date: 08/09/2022  Time Spent: 30 mins  Treatment Type: Individual Therapy  Reported Symptoms: Pt presents for the session via WebEx video.  Pt grants consent for the session, stating he is in his home with no one else present.  I shared with pt that I am in my office with no one else here either.  Mental Status Exam: Appearance:  Casual     Behavior: Appropriate  Motor: Normal  Speech/Language:  Clear and Coherent  Affect: Appropriate  Mood: normal  Thought process: normal  Thought content:   WNL  Sensory/Perceptual disturbances:   WNL  Orientation: oriented to person, place, and time/date  Attention: Good  Concentration: Good  Memory: WNL  Fund of knowledge:  Good  Insight:   Good  Judgment:  Good  Impulse Control: Good   Risk Assessment: Danger to Self:  No Self-injurious Behavior: No Danger to Others: No Duty to Warn:no Physical Aggression / Violence:No  Access to Firearms a concern: No  Gang Involvement:No   Subjective: Pt shares, "It has been a mixed bag.  I got a cold last week that hit me really hard.  I have been going to work but have been coming home early some days to rest.  Also, I had some extra responsibilities this week because a coworker was out and I had more to do than normal this past week."  Pt shares his parents are doing well; they are meeting with their financial advisor today about their retirement savings and are planning to go to dinner and a show and are still planning to come to [facility] and stay a couple of weeks in late April.  Pt is planning to take one of those weeks off from work to spend with them.  Pt shares he missed his D&D game last week because of not feeling well and another player had a death in his family.  They do plan to play Saturday night again.  He has also been watching some recordings of other D&D games to get story ideas from  them and he has enjoyed that as well.  Pt has not been walking as much in the past 2 wks due to being sick part of that time.  Encouraged him to continue with his self care activities and we will meet in 2 wks for a follow up session.    Interventions: Cognitive Behavioral Therapy  Diagnosis:Mild episode of recurrent major depressive disorder ([place])  Plan: Treatment Plan Strengths/Abilities:  Intelligent, Intuitive, Willing to participate in therapy Treatment Preferences:  Outpatient Individual Therapy Statement of Needs:  Patient is to use CBT, mindfulness and coping skills to help manage and/or decrease symptoms associated with their diagnosis. Symptoms:  Depressed/Irritable mood, worry, social withdrawal Problems Addressed:  Depressive thoughts, Sadness, Sleep issues, etc. Long Term Goals:  Pt to reduce overall level, frequency, and intensity of the feelings of depression as evidenced by decreased irritability, negative self talk, and helpless feelings from 6 to 7 days/week to 0 to 1 days/week, per client report, for at least 3 consecutive months.  Progress: 10% Short Term Goals:  Pt to verbally express understanding of the relationship between feelings of depression and their impact on thinking patterns and behaviors.  Pt to verbalize an understanding of the role that distorted thinking plays in creating fears, excessive worry, and ruminations.  Progress: 10% Target Date:  04/18/2023 Frequency:  Bi-weekly Modality:  Cognitive Behavioral  Therapy Interventions by Therapist:  Therapist will use CBT, Mindfulness exercises, Coping skills and Referrals, as needed by client. Client has verbally approved this treatment plan.  [name], [facility]

## 2022-08-21 ENCOUNTER — Ambulatory Visit (INDEPENDENT_AMBULATORY_CARE_PROVIDER_SITE_OTHER): Payer: BC Managed Care – PPO | Admitting: Psychology

## 2022-08-21 DIAGNOSIS — F330 Major depressive disorder, recurrent, mild: Secondary | ICD-10-CM | POA: Diagnosis not present

## 2022-08-21 NOTE — Progress Notes (Signed)
[place] Counselor/Therapist Progress Note  Patient ID: Noah Jones, MRN: [id number],    Date: 08/21/2022  Time Spent: 30 mins  Treatment Type: Individual Therapy  Reported Symptoms: Pt presents for the session via WebEx video.  Pt grants consent for the session, stating he is in his home with no one else present.  I shared with pt that I am in my office with no one else here either.  Mental Status Exam: Appearance:  Casual     Behavior: Appropriate  Motor: Normal  Speech/Language:  Clear and Coherent  Affect: Appropriate  Mood: normal  Thought process: normal  Thought content:   WNL  Sensory/Perceptual disturbances:   WNL  Orientation: oriented to person, place, and time/date  Attention: Good  Concentration: Good  Memory: WNL  Fund of knowledge:  Good  Insight:   Good  Judgment:  Good  Impulse Control: Good   Risk Assessment: Danger to Self:  No Self-injurious Behavior: No Danger to Others: No Duty to Warn:no Physical Aggression / Violence:No  Access to Firearms a concern: No  Gang Involvement:No   Subjective: Pt shares, "It has been a good couple of weeks.  I think I twinged my back while sneezing this week and it is still a little uncomfortable."  Pt shares he is taking ibuprofen and using ice and warm baths to help loosen the muscles in his back.  Pt shares that he is mostly over his cold that he had in our last session.  Pt continues to play D&D and he got some new games in the mail today and is excited to open the packages to see which ones came today.  Pt shares that he has been eating pretty good since our last session; he has been eating several salads lately, and they have been vegan in nature.  He does enjoy chicken and fish and other forms of protein as well.  Pt shares his sleep has been pretty good since our last session.  Pt shares his parents are still coming to visit him in April and he is looking forward to that visit.  He is planning to  take some time off from work while his family is coming.  Pt has not been walking much in the past week due to his back issue but hopes to get back out there this weekend, weather permitting.  Encouraged him to continue with his self care activities and we will meet in 4 wks for a follow up session.    Interventions: Cognitive Behavioral Therapy  Diagnosis:Mild episode of recurrent major depressive disorder ([place])  Plan: Treatment Plan Strengths/Abilities:  Intelligent, Intuitive, Willing to participate in therapy Treatment Preferences:  Outpatient Individual Therapy Statement of Needs:  Patient is to use CBT, mindfulness and coping skills to help manage and/or decrease symptoms associated with their diagnosis. Symptoms:  Depressed/Irritable mood, worry, social withdrawal Problems Addressed:  Depressive thoughts, Sadness, Sleep issues, etc. Long Term Goals:  Pt to reduce overall level, frequency, and intensity of the feelings of depression as evidenced by decreased irritability, negative self talk, and helpless feelings from 6 to 7 days/week to 0 to 1 days/week, per client report, for at least 3 consecutive months.  Progress: 10% Short Term Goals:  Pt to verbally express understanding of the relationship between feelings of depression and their impact on thinking patterns and behaviors.  Pt to verbalize an understanding of the role that distorted thinking plays in creating fears, excessive worry, and ruminations.  Progress: 10% Target Date:  04/18/2023 Frequency:  Bi-weekly Modality:  Cognitive Behavioral Therapy Interventions by Therapist:  Therapist will use CBT, Mindfulness exercises, Coping skills and Referrals, as needed by client. Client has verbally approved this treatment plan.  [name], [facility]

## 2022-09-20 ENCOUNTER — Ambulatory Visit (INDEPENDENT_AMBULATORY_CARE_PROVIDER_SITE_OTHER): Payer: BC Managed Care – PPO | Admitting: Psychology

## 2022-09-20 DIAGNOSIS — F330 Major depressive disorder, recurrent, mild: Secondary | ICD-10-CM

## 2022-09-20 NOTE — Progress Notes (Signed)
[facility] Counselor/Therapist Progress Note  Patient ID: Noah Jones, MRN: [id number],    Date: 09/20/2022  Time Spent: 30 mins  Treatment Type: Individual Therapy  Reported Symptoms: Pt presents for the session via WebEx video.  Pt grants consent for the session, stating he is in his home with no one else present.  I shared with pt that I am in my office with no one else here either.  Mental Status Exam: Appearance:  Casual     Behavior: Appropriate  Motor: Normal  Speech/Language:  Clear and Coherent  Affect: Appropriate  Mood: normal  Thought process: normal  Thought content:   WNL  Sensory/Perceptual disturbances:   WNL  Orientation: oriented to person, place, and time/date  Attention: Good  Concentration: Good  Memory: WNL  Fund of knowledge:  Good  Insight:   Good  Judgment:  Good  Impulse Control: Good   Risk Assessment: Danger to Self:  No Self-injurious Behavior: No Danger to Others: No Duty to Warn:no Physical Aggression / Violence:No  Access to Firearms a concern: No  Gang Involvement:No   Subjective: Pt shares, "Things have been pretty good since our last session.  Work has been a bit stressful but that is not unusual for work."  Pt shares that his parents are coming down this week for a visit.  They will be here for 2 wks; pt is taking Monday, Friday, and Wed-Fri of the next week.  Pt shares that his back has gotten better and he is glad to be back to 100%.  Pt continues to play D&D and he has one more mtg tomorrow before he takes a couple of weeks off while his parents are here.  He is still enjoying planing his board games, including the new ones he got on the date of our last session.  He is looking forward to playing with them while they are here.  Pt shares he does not believe that his parents will try to connect with Madelaine Bhat and Fleet Contras because they had cut everyone off in their most recent communication.  Pt shares that he has been eating  pretty good since our last session.  Pt shares that he believes that he is now in a good spot to finish our current work together.  He is able to note several positive things he has learned during our work that has been beneficial for him.  I shared with him that, if he ever has any other needs to address in therapy, I would appreciate the opportunity to work with him again and he can connect with me anytime he needs to by simply calling the office to schedule another session.   Interventions: Cognitive Behavioral Therapy  Diagnosis:Mild episode of recurrent major depressive disorder  Plan: Treatment Plan Strengths/Abilities:  Intelligent, Intuitive, Willing to participate in therapy Treatment Preferences:  Outpatient Individual Therapy Statement of Needs:  Patient is to use CBT, mindfulness and coping skills to help manage and/or decrease symptoms associated with their diagnosis. Symptoms:  Depressed/Irritable mood, worry, social withdrawal Problems Addressed:  Depressive thoughts, Sadness, Sleep issues, etc. Long Term Goals:  Pt to reduce overall level, frequency, and intensity of the feelings of depression as evidenced by decreased irritability, negative self talk, and helpless feelings from 6 to 7 days/week to 0 to 1 days/week, per client report, for at least 3 consecutive months.  Progress: 10% Short Term Goals:  Pt to verbally express understanding of the relationship between feelings of depression and their impact on  thinking patterns and behaviors.  Pt to verbalize an understanding of the role that distorted thinking plays in creating fears, excessive worry, and ruminations.  Progress: 10% Target Date:  04/18/2023 Frequency:  Bi-weekly Modality:  Cognitive Behavioral Therapy Interventions by Therapist:  Therapist will use CBT, Mindfulness exercises, Coping skills and Referrals, as needed by client. Client has verbally approved this treatment plan.  Karie Kirks, [facility]

## 2022-10-29 ENCOUNTER — Other Ambulatory Visit: Payer: Self-pay | Admitting: Internal Medicine

## 2022-10-29 DIAGNOSIS — F322 Major depressive disorder, single episode, severe without psychotic features: Secondary | ICD-10-CM

## 2022-11-03 ENCOUNTER — Other Ambulatory Visit: Payer: Self-pay | Admitting: Internal Medicine

## 2022-11-03 DIAGNOSIS — I10 Essential (primary) hypertension: Secondary | ICD-10-CM

## 2022-11-03 DIAGNOSIS — F322 Major depressive disorder, single episode, severe without psychotic features: Secondary | ICD-10-CM

## 2022-12-25 ENCOUNTER — Ambulatory Visit: Payer: BC Managed Care – PPO | Admitting: Internal Medicine

## 2022-12-25 ENCOUNTER — Encounter: Payer: Self-pay | Admitting: Internal Medicine

## 2022-12-25 VITALS — BP 150/98 | HR 80 | Temp 98.4°F | Ht 70.0 in | Wt 234.8 lb

## 2022-12-25 DIAGNOSIS — E781 Pure hyperglyceridemia: Secondary | ICD-10-CM | POA: Diagnosis not present

## 2022-12-25 DIAGNOSIS — R0609 Other forms of dyspnea: Secondary | ICD-10-CM | POA: Insufficient documentation

## 2022-12-25 DIAGNOSIS — I10 Essential (primary) hypertension: Secondary | ICD-10-CM | POA: Diagnosis not present

## 2022-12-25 DIAGNOSIS — R9431 Abnormal electrocardiogram [ECG] [EKG]: Secondary | ICD-10-CM

## 2022-12-25 LAB — CBC WITH DIFFERENTIAL/PLATELET
Basophils Absolute: 0.1 10*3/uL (ref 0.0–0.1)
Basophils Relative: 0.9 % (ref 0.0–3.0)
Eosinophils Absolute: 0.1 10*3/uL (ref 0.0–0.7)
Eosinophils Relative: 1.4 % (ref 0.0–5.0)
HCT: 46.4 % (ref 39.0–52.0)
Hemoglobin: 15.4 g/dL (ref 13.0–17.0)
Lymphocytes Relative: 32 % (ref 12.0–46.0)
Lymphs Abs: 1.9 10*3/uL (ref 0.7–4.0)
MCHC: 33.1 g/dL (ref 30.0–36.0)
MCV: 89.3 fl (ref 78.0–100.0)
Monocytes Absolute: 0.5 10*3/uL (ref 0.1–1.0)
Monocytes Relative: 8.4 % (ref 3.0–12.0)
Neutro Abs: 3.5 10*3/uL (ref 1.4–7.7)
Neutrophils Relative %: 57.3 % (ref 43.0–77.0)
Platelets: 228 10*3/uL (ref 150.0–400.0)
RBC: 5.2 Mil/uL (ref 4.22–5.81)
RDW: 13.3 % (ref 11.5–15.5)
WBC: 6 10*3/uL (ref 4.0–10.5)

## 2022-12-25 LAB — URINALYSIS, ROUTINE W REFLEX MICROSCOPIC
Bilirubin Urine: NEGATIVE
Hgb urine dipstick: NEGATIVE
Ketones, ur: NEGATIVE
Leukocytes,Ua: NEGATIVE
Nitrite: NEGATIVE
Specific Gravity, Urine: 1.02 (ref 1.000–1.030)
Total Protein, Urine: NEGATIVE
Urine Glucose: NEGATIVE
Urobilinogen, UA: 0.2 (ref 0.0–1.0)
pH: 7 (ref 5.0–8.0)

## 2022-12-25 LAB — BASIC METABOLIC PANEL
BUN: 21 mg/dL (ref 6–23)
CO2: 23 mEq/L (ref 19–32)
Calcium: 9.5 mg/dL (ref 8.4–10.5)
Chloride: 103 mEq/L (ref 96–112)
Creatinine, Ser: 0.83 mg/dL (ref 0.40–1.50)
GFR: 111.29 mL/min (ref >60.00)
Glucose, Bld: 108 mg/dL — ABNORMAL HIGH (ref 70–99)
Potassium: 4 mEq/L (ref 3.5–5.1)
Sodium: 136 mEq/L (ref 135–145)

## 2022-12-25 LAB — TROPONIN I (HIGH SENSITIVITY): High Sens Troponin I: 4 ng/L (ref 2–17)

## 2022-12-25 LAB — HEPATIC FUNCTION PANEL
ALT: 69 U/L — ABNORMAL HIGH (ref 0–53)
AST: 30 U/L (ref 0–37)
Albumin: 4.7 g/dL (ref 3.5–5.2)
Alkaline Phosphatase: 98 U/L (ref 39–117)
Bilirubin, Direct: 0.1 mg/dL (ref 0.0–0.3)
Total Bilirubin: 0.9 mg/dL (ref 0.2–1.2)
Total Protein: 8.1 g/dL (ref 6.0–8.3)

## 2022-12-25 LAB — TSH: TSH: 0.63 u[IU]/mL (ref 0.35–5.50)

## 2022-12-25 LAB — BRAIN NATRIURETIC PEPTIDE: Pro B Natriuretic peptide (BNP): 6 pg/mL (ref 0.0–100.0)

## 2022-12-25 LAB — TRIGLYCERIDES: Triglycerides: 177 mg/dL — ABNORMAL HIGH (ref 0.0–149.0)

## 2022-12-25 MED ORDER — OLMESARTAN MEDOXOMIL 20 MG PO TABS
20.0000 mg | ORAL_TABLET | Freq: Every day | ORAL | 0 refills | Status: DC
Start: 2022-12-25 — End: 2023-03-23

## 2022-12-25 MED ORDER — INDAPAMIDE 1.25 MG PO TABS
1.2500 mg | ORAL_TABLET | Freq: Every day | ORAL | 0 refills | Status: DC
Start: 2022-12-25 — End: 2023-03-23

## 2022-12-25 NOTE — Progress Notes (Signed)
Subjective:  Patient ID: Noah Jones, male    DOB: 12-23-1985  Age: 37 y.o. MRN: [id number]  CC: Hypertension and Hyperlipidemia   HPI Noah Jones presents for f/up ----  Discussed the use of AI scribe software for clinical note transcription with the patient, who gave verbal consent to proceed.  History of Present Illness   The patient, with a history of hypertension, presents with concerns of elevated blood pressure readings ranging from 140 to 155, according to prescription refills he is not taking losartan. They suspect a possible tolerance to the medication. They deny experiencing headaches, blurred vision, chest pain, dizziness, or lightheadedness, which could be indicative of hypertensive emergencies. However, they do report a slight shortness of breath with physical activity.  In addition, the patient has noticed an unexplained weight gain of approximately 12 pounds, despite maintaining their usual level of physical activity and dietary habits, which includes a reduction in sugar intake.      Past Medical History:  Diagnosis Date   Allergy    Headache    Past Surgical History:  Procedure Laterality Date   INGUINAL HERNIA REPAIR     SPINE SURGERY     WISDOM TOOTH EXTRACTION      reports that he has never smoked. He has never used smokeless tobacco. He reports that he does not currently use alcohol. He reports that he does not use drugs. family history includes Cancer - Lung in an other family member; Dementia in his paternal grandmother; Hypercholesterolemia in his paternal uncle; Hypertension in his brother, father, and mother; Stroke in his maternal grandfather. Allergies  Allergen Reactions   Other Other (See Comments)    Mayonnaise-Stomach upset; Pickles-Vomiting     ROS Review of Systems  Constitutional:  Positive for unexpected weight change (wt gain). Negative for chills, diaphoresis and fatigue.  HENT: Negative.    Eyes: Negative.  Negative for visual  disturbance.  Respiratory:  Positive for shortness of breath (DOE for 3 months). Negative for apnea, cough, chest tightness, wheezing and stridor.   Cardiovascular:  Negative for chest pain, palpitations and leg swelling.  Gastrointestinal:  Negative for abdominal pain, constipation, diarrhea, nausea and vomiting.  Genitourinary: Negative.  Negative for difficulty urinating, dysuria and hematuria.  Musculoskeletal: Negative.   Skin: Negative.   Neurological: Negative.  Negative for dizziness, weakness, light-headedness and headaches.  Hematological:  Negative for adenopathy. Does not bruise/bleed easily.  Psychiatric/Behavioral:  Negative for confusion, decreased concentration, dysphoric mood and sleep disturbance. The patient is nervous/anxious.     Objective:  BP (!) 150/98 (BP Location: Left Arm, Patient Position: Sitting, Cuff Size: Large)   Pulse 80   Temp 98.4 F (36.9 C) (Oral)   Ht 5\' 10"  (1.778 m)   Wt 234 lb 12.8 oz (106.5 kg)   SpO2 98%   BMI 33.69 kg/m   BP Readings from Last 3 Encounters:  12/25/22 (!) 150/98  03/19/22 (!) 144/88  08/29/20 136/76    Wt Readings from Last 3 Encounters:  12/25/22 234 lb 12.8 oz (106.5 kg)  03/19/22 222 lb (100.7 kg)  01/25/21 194 lb (88 kg)    Physical Exam Vitals reviewed.  Constitutional:      Appearance: Normal appearance.  HENT:     Mouth/Throat:     Mouth: Mucous membranes are moist.  Eyes:     General: No scleral icterus.    Conjunctiva/sclera: Conjunctivae normal.  Cardiovascular:     Rate and Rhythm: Normal rate and regular rhythm.  Heart sounds: Normal heart sounds, S1 normal and S2 normal. No murmur heard.    No friction rub. No gallop.     Comments: EKG- NSR, 83 bpm NS T wave changes No LVH or Q waves Pulmonary:     Effort: Pulmonary effort is normal.     Breath sounds: No stridor. No wheezing, rhonchi or rales.  Abdominal:     General: Abdomen is flat.     Palpations: There is no mass.      Tenderness: There is no abdominal tenderness. There is no guarding.     Hernia: No hernia is present.  Musculoskeletal:     Cervical back: Neck supple.     Right lower leg: No edema.     Left lower leg: No edema.  Skin:    General: Skin is warm and dry.     Coloration: Skin is not pale.  Neurological:     General: No focal deficit present.     Mental Status: He is alert. Mental status is at baseline.  Psychiatric:        Mood and Affect: Mood normal.        Behavior: Behavior normal.     Lab Results  Component Value Date   WBC 6.0 12/25/2022   HGB 15.4 12/25/2022   HCT 46.4 12/25/2022   PLT 228.0 12/25/2022   GLUCOSE 108 (H) 12/25/2022   CHOL 164 03/19/2022   TRIG 177.0 (H) 12/25/2022   HDL 37.50 (L) 03/19/2022   LDLDIRECT 102.0 03/19/2022   LDLCALC 117 (H) 11/27/2017   ALT 69 (H) 12/25/2022   AST 30 12/25/2022   NA 136 12/25/2022   K 4.0 12/25/2022   CL 103 12/25/2022   CREATININE 0.83 12/25/2022   BUN 21 12/25/2022   CO2 23 12/25/2022   TSH 0.63 12/25/2022   HGBA1C 5.2 10/26/2019    MR CERVICAL SPINE W WO CONTRAST  Result Date: 11/11/2019 [facility] NEUROIMAGING REPORT STUDY DATE: 11/09/19 PATIENT NAME: Noah Jones DOB: 1986/05/09 MRN: [id number] ORDERING CLINICIAN: Suanne Marker, MD CLINICAL HISTORY: 37 year old male with numbness. EXAM: MR CERVICAL SPINE W WO CONTRAST TECHNIQUE: MRI of the cervical spine was obtained utilizing 3 mm sagittal slices from the posterior fossa down to the T3-4 level with T1, T2 and inversion recovery views. In addition 4 mm axial slices from C2-3 down to T1-2 level were included with T2 and gradient echo views. CONTRAST: 17ml multihance COMPARISON: none IMAGING SITE: [facility] [address] (1.5 Tesla MRI)  FINDINGS: On sagittal views the vertebral bodies have normal height and alignment. Minimal disc bulging at C5-6. The spinal cord is normal in size and appearance. The posterior fossa, pituitary  gland and paraspinal soft tissues are unremarkable.  On axial views there is no spinal stenosis or foraminal narrowing. Limited views of the soft tissues of the head and neck are unremarkable.   Normal MRI cervical spine (with and without). INTERPRETING PHYSICIAN: Suanne Marker, MD Certified in Neurology, Neurophysiology and Neuroimaging [facility] [address], [address] [place], [place] [address] [phone]   MR BRAIN W WO CONTRAST  Result Date: 11/11/2019 [facility] NEUROIMAG[facility] REPORT STUDY DATE: 11/09/19 PATIENT NAME: Telford Archambeau DOB: Sep 15, 1985 MRN: [id number] ORDERING CLINICIAN: Suanne Marker, MD CLINICAL HISTORY: 37 year old male with numbness. EXAM: MR BRAIN W WO CONTRAST TECHNIQUE: MRI of the brain with and without contrast was obtained utilizing 5 mm axial slices with T1, T2, T2 flair, SWI and  diffusion weighted views.  T1 sagittal, T2 coronal and postcontrast views in the axial and coronal plane were obtained. CONTRAST: 17ml multihance COMPARISON: none IMAGING SITE: [facility] [address] (1.5 Tesla MRI)  FINDINGS: No abnormal lesions are seen on diffusion-weighted views to suggest acute ischemia. The cortical sulci, fissures and cisterns are normal in size and appearance. Lateral, third and fourth ventricle are normal in size and appearance. No extra-axial fluid collections are seen. No evidence of mass effect or midline shift.  No abnormal lesions are seen on post contrast views.  On sagittal views the posterior fossa, pituitary gland and corpus callosum are unremarkable. No evidence of intracranial hemorrhage on SWI views. The orbits and their contents, paranasal sinuses and calvarium are unremarkable.  Intracranial flow voids are present.   Normal MRI brain (with and without). INTERPRETING PHYSICIAN: Suanne Marker, MD Certified in Neurology, Neurophysiology and Neuroimaging [facility] [address], [address] [place], [place] [address] [phone]    Assessment & Plan:   Primary hypertension- EKG is negative for LVH.  Labs are negative for secondary causes or endorgan damage.  Will upgrade to a more potent ARB and will add a thiazide diuretic. -     EKG 12-Lead -     Urinalysis, Routine w reflex microscopic; Future -     TSH; Future -     Hepatic function panel; Future -     CBC with Differential/Platelet; Future -     Basic metabolic panel; Future -     Olmesartan Medoxomil; Take 1 tablet (20 mg total) by mouth daily.  Dispense: 90 tablet; Refill: 0 -     Indapamide; Take 1 tablet (1.25 mg total) by mouth daily.  Dispense: 90 tablet; Refill: 0  Pure hypertriglyceridemia - He is working on his lifestyle modifications. -     Triglycerides; Future  DOE (dyspnea on exertion)- Cardiac enzymes are negative.  This is likely related to poorly controlled hypertension. -     Brain natriuretic peptide; Future -     Troponin I (High Sensitivity); Future  Abnormal electrocardiogram (ECG) (EKG) -     Brain natriuretic peptide; Future -     Troponin I (High Sensitivity); Future     Follow-up: Return in about 3 months (around 03/27/2023).  Sanda Linger, MD

## 2022-12-25 NOTE — Patient Instructions (Signed)
Hypertension, Adult High blood pressure (hypertension) is when the force of blood pumping through the arteries is too strong. The arteries are the blood vessels that carry blood from the heart throughout the body. Hypertension forces the heart to work harder to pump blood and may cause arteries to become narrow or stiff. Untreated or uncontrolled hypertension can lead to a heart attack, heart failure, a stroke, kidney disease, and other problems. A blood pressure reading consists of a higher number over a lower number. Ideally, your blood pressure should be below 120/80. The first ("top") number is called the systolic pressure. It is a measure of the pressure in your arteries as your heart beats. The second ("bottom") number is called the diastolic pressure. It is a measure of the pressure in your arteries as the heart relaxes. What are the causes? The exact cause of this condition is not known. There are some conditions that result in high blood pressure. What increases the risk? Certain factors may make you more likely to develop high blood pressure. Some of these risk factors are under your control, including: Smoking. Not getting enough exercise or physical activity. Being overweight. Having too much fat, sugar, calories, or salt (sodium) in your diet. Drinking too much alcohol. Other risk factors include: Having a personal history of heart disease, diabetes, high cholesterol, or kidney disease. Stress. Having a family history of high blood pressure and high cholesterol. Having obstructive sleep apnea. Age. The risk increases with age. What are the signs or symptoms? High blood pressure may not cause symptoms. Very high blood pressure (hypertensive crisis) may cause: Headache. Fast or irregular heartbeats (palpitations). Shortness of breath. Nosebleed. Nausea and vomiting. Vision changes. Severe chest pain, dizziness, and seizures. How is this diagnosed? This condition is diagnosed by  measuring your blood pressure while you are seated, with your arm resting on a flat surface, your legs uncrossed, and your feet flat on the floor. The cuff of the blood pressure monitor will be placed directly against the skin of your upper arm at the level of your heart. Blood pressure should be measured at least twice using the same arm. Certain conditions can cause a difference in blood pressure between your right and left arms. If you have a high blood pressure reading during one visit or you have normal blood pressure with other risk factors, you may be asked to: Return on a different day to have your blood pressure checked again. Monitor your blood pressure at home for 1 week or longer. If you are diagnosed with hypertension, you may have other blood or imaging tests to help your health care provider understand your overall risk for other conditions. How is this treated? This condition is treated by making healthy lifestyle changes, such as eating healthy foods, exercising more, and reducing your alcohol intake. You may be referred for counseling on a healthy diet and physical activity. Your health care provider may prescribe medicine if lifestyle changes are not enough to get your blood pressure under control and if: Your systolic blood pressure is above 130. Your diastolic blood pressure is above 80. Your personal target blood pressure may vary depending on your medical conditions, your age, and other factors. Follow these instructions at home: Eating and drinking  Eat a diet that is high in fiber and potassium, and low in sodium, added sugar, and fat. An example of this eating plan is called the DASH diet. DASH stands for Dietary Approaches to Stop Hypertension. To eat this way: Eat   plenty of fresh fruits and vegetables. Try to fill one half of your plate at each meal with fruits and vegetables. Eat whole grains, such as whole-wheat pasta, brown rice, or whole-grain bread. Fill about one  fourth of your plate with whole grains. Eat or drink low-fat dairy products, such as skim milk or low-fat yogurt. Avoid fatty cuts of meat, processed or cured meats, and poultry with skin. Fill about one fourth of your plate with lean proteins, such as fish, chicken without skin, beans, eggs, or tofu. Avoid pre-made and processed foods. These tend to be higher in sodium, added sugar, and fat. Reduce your daily sodium intake. Many people with hypertension should eat less than 1,500 mg of sodium a day. Do not drink alcohol if: Your health care provider tells you not to drink. You are pregnant, may be pregnant, or are planning to become pregnant. If you drink alcohol: Limit how much you have to: 0-1 drink a day for women. 0-2 drinks a day for men. Know how much alcohol is in your drink. In the [place] one drink equals one 12 oz bottle of beer (355 mL), one 5 oz glass of wine (148 mL), or one 1 oz glass of hard liquor (44 mL). Lifestyle  Work with your health care provider to maintain a healthy body weight or to lose weight. Ask what an ideal weight is for you. Get at least 30 minutes of exercise that causes your heart to beat faster (aerobic exercise) most days of the week. Activities may include walking, swimming, or biking. Include exercise to strengthen your muscles (resistance exercise), such as Pilates or lifting weights, as part of your weekly exercise routine. Try to do these types of exercises for 30 minutes at least 3 days a week. Do not use any products that contain nicotine or tobacco. These products include cigarettes, chewing tobacco, and vaping devices, such as e-cigarettes. If you need help quitting, ask your health care provider. Monitor your blood pressure at home as told by your health care provider. Keep all follow-up visits. This is important. Medicines Take over-the-counter and prescription medicines only as told by your health care provider. Follow directions carefully. Blood  pressure medicines must be taken as prescribed. Do not skip doses of blood pressure medicine. Doing this puts you at risk for problems and can make the medicine less effective. Ask your health care provider about side effects or reactions to medicines that you should watch for. Contact a health care provider if you: Think you are having a reaction to a medicine you are taking. Have headaches that keep coming back (recurring). Feel dizzy. Have swelling in your ankles. Have trouble with your vision. Get help right away if you: Develop a severe headache or confusion. Have unusual weakness or numbness. Feel faint. Have severe pain in your chest or abdomen. Vomit repeatedly. Have trouble breathing. These symptoms may be an emergency. Get help right away. Call 911. Do not wait to see if the symptoms will go away. Do not drive yourself to the hospital. Summary Hypertension is when the force of blood pumping through your arteries is too strong. If this condition is not controlled, it may put you at risk for serious complications. Your personal target blood pressure may vary depending on your medical conditions, your age, and other factors. For most people, a normal blood pressure is less than 120/80. Hypertension is treated with lifestyle changes, medicines, or a combination of both. Lifestyle changes include losing weight, eating a healthy,   low-sodium diet, exercising more, and limiting alcohol. This information is not intended to replace advice given to you by your health care provider. Make sure you discuss any questions you have with your health care provider. Document Revised: 03/27/2021 Document Reviewed: 03/27/2021 Elsevier Patient Education  2024 Else[employer] Inc.  

## 2023-01-28 ENCOUNTER — Other Ambulatory Visit: Payer: Self-pay | Admitting: Internal Medicine

## 2023-01-28 DIAGNOSIS — F322 Major depressive disorder, single episode, severe without psychotic features: Secondary | ICD-10-CM

## 2023-02-16 ENCOUNTER — Other Ambulatory Visit: Payer: Self-pay | Admitting: Internal Medicine

## 2023-02-16 DIAGNOSIS — F322 Major depressive disorder, single episode, severe without psychotic features: Secondary | ICD-10-CM

## 2023-03-23 ENCOUNTER — Other Ambulatory Visit: Payer: Self-pay | Admitting: Internal Medicine

## 2023-03-23 DIAGNOSIS — I10 Essential (primary) hypertension: Secondary | ICD-10-CM

## 2023-04-23 ENCOUNTER — Ambulatory Visit: Payer: BC Managed Care – PPO | Admitting: Internal Medicine

## 2023-04-23 ENCOUNTER — Encounter: Payer: Self-pay | Admitting: Internal Medicine

## 2023-04-23 ENCOUNTER — Other Ambulatory Visit: Payer: Self-pay | Admitting: Internal Medicine

## 2023-04-23 VITALS — BP 132/84 | HR 86 | Temp 98.4°F | Resp 16 | Ht 70.0 in | Wt 239.0 lb

## 2023-04-23 DIAGNOSIS — F322 Major depressive disorder, single episode, severe without psychotic features: Secondary | ICD-10-CM

## 2023-04-23 DIAGNOSIS — Z0001 Encounter for general adult medical examination with abnormal findings: Secondary | ICD-10-CM | POA: Diagnosis not present

## 2023-04-23 DIAGNOSIS — J301 Allergic rhinitis due to pollen: Secondary | ICD-10-CM

## 2023-04-23 DIAGNOSIS — I10 Essential (primary) hypertension: Secondary | ICD-10-CM

## 2023-04-23 LAB — BASIC METABOLIC PANEL
BUN: 21 mg/dL (ref 6–23)
CO2: 29 meq/L (ref 19–32)
Calcium: 9.9 mg/dL (ref 8.4–10.5)
Chloride: 98 meq/L (ref 96–112)
Creatinine, Ser: 0.91 mg/dL (ref 0.40–1.50)
GFR: 106.92 mL/min (ref >60.00)
Glucose, Bld: 95 mg/dL (ref 70–99)
Potassium: 3.6 meq/L (ref 3.5–5.1)
Sodium: 136 meq/L (ref 135–145)

## 2023-04-23 MED ORDER — LEVOCETIRIZINE DIHYDROCHLORIDE 5 MG PO TABS
5.0000 mg | ORAL_TABLET | Freq: Every evening | ORAL | 1 refills | Status: DC
Start: 2023-04-23 — End: 2023-10-21

## 2023-04-23 MED ORDER — MONTELUKAST SODIUM 10 MG PO TABS
10.0000 mg | ORAL_TABLET | Freq: Every day | ORAL | 1 refills | Status: DC
Start: 2023-04-23 — End: 2023-10-20

## 2023-04-23 MED ORDER — FLUTICASONE PROPIONATE 50 MCG/ACT NA SUSP
2.0000 | Freq: Every day | NASAL | 1 refills | Status: AC
Start: 2023-04-23 — End: ?

## 2023-04-23 NOTE — Patient Instructions (Signed)
Health Maintenance, Male Adopting a healthy lifestyle and getting preventive care are important in promoting health and wellness. Ask your health care provider about: The right schedule for you to have regular tests and exams. Things you can do on your own to prevent diseases and keep yourself healthy. What should I know about diet, weight, and exercise? Eat a healthy diet  Eat a diet that includes plenty of vegetables, fruits, low-fat dairy products, and lean protein. Do not eat a lot of foods that are high in solid fats, added sugars, or sodium. Maintain a healthy weight Body mass index (BMI) is a measurement that can be used to identify possible weight problems. It estimates body fat based on height and weight. Your health care provider can help determine your BMI and help you achieve or maintain a healthy weight. Get regular exercise Get regular exercise. This is one of the most important things you can do for your health. Most adults should: Exercise for at least 150 minutes each week. The exercise should increase your heart rate and make you sweat (moderate-intensity exercise). Do strengthening exercises at least twice a week. This is in addition to the moderate-intensity exercise. Spend less time sitting. Even light physical activity can be beneficial. Watch cholesterol and blood lipids Have your blood tested for lipids and cholesterol at 38 years of age, then have this test every 5 years. You may need to have your cholesterol levels checked more often if: Your lipid or cholesterol levels are high. You are older than 38 years of age. You are at high risk for heart disease. What should I know about cancer screening? Many types of cancers can be detected early and may often be prevented. Depending on your health history and family history, you may need to have cancer screening at various ages. This may include screening for: Colorectal cancer. Prostate cancer. Skin cancer. Lung  cancer. What should I know about heart disease, diabetes, and high blood pressure? Blood pressure and heart disease High blood pressure causes heart disease and increases the risk of stroke. This is more likely to develop in people who have high blood pressure readings or are overweight. Talk with your health care provider about your target blood pressure readings. Have your blood pressure checked: Every 3-5 years if you are 18-39 years of age. Every year if you are 40 years old or older. If you are between the ages of 65 and 75 and are a current or former smoker, ask your health care provider if you should have a one-time screening for abdominal aortic aneurysm (AAA). Diabetes Have regular diabetes screenings. This checks your fasting blood sugar level. Have the screening done: Once every three years after age 45 if you are at a normal weight and have a low risk for diabetes. More often and at a younger age if you are overweight or have a high risk for diabetes. What should I know about preventing infection? Hepatitis B If you have a higher risk for hepatitis B, you should be screened for this virus. Talk with your health care provider to find out if you are at risk for hepatitis B infection. Hepatitis C Blood testing is recommended for: Everyone born from 1945 through 1965. Anyone with known risk factors for hepatitis C. Sexually transmitted infections (STIs) You should be screened each year for STIs, including gonorrhea and chlamydia, if: You are sexually active and are younger than 38 years of age. You are older than 38 years of age and your   health care provider tells you that you are at risk for this type of infection. Your sexual activity has changed since you were last screened, and you are at increased risk for chlamydia or gonorrhea. Ask your health care provider if you are at risk. Ask your health care provider about whether you are at high risk for HIV. Your health care provider  may recommend a prescription medicine to help prevent HIV infection. If you choose to take medicine to prevent HIV, you should first get tested for HIV. You should then be tested every 3 months for as long as you are taking the medicine. Follow these instructions at home: Alcohol use Do not drink alcohol if your health care provider tells you not to drink. If you drink alcohol: Limit how much you have to 0-2 drinks a day. Know how much alcohol is in your drink. In the [place] one drink equals one 12 oz bottle of beer (355 mL), one 5 oz glass of wine (148 mL), or one 1 oz glass of hard liquor (44 mL). Lifestyle Do not use any products that contain nicotine or tobacco. These products include cigarettes, chewing tobacco, and vaping devices, such as e-cigarettes. If you need help quitting, ask your health care provider. Do not use street drugs. Do not share needles. Ask your health care provider for help if you need support or information about quitting drugs. General instructions Schedule regular health, dental, and eye exams. Stay current with your vaccines. Tell your health care provider if: You often feel depressed. You have ever been abused or do not feel safe at home. Summary Adopting a healthy lifestyle and getting preventive care are important in promoting health and wellness. Follow your health care provider's instructions about healthy diet, exercising, and getting tested or screened for diseases. Follow your health care provider's instructions on monitoring your cholesterol and blood pressure. This information is not intended to replace advice given to you by your health care provider. Make sure you discuss any questions you have with your health care provider. Document Revised: 10/09/2020 Document Reviewed: 10/09/2020 Elsevier Patient Education  2024 [employer] Inc.  

## 2023-04-23 NOTE — Progress Notes (Signed)
Subjective:  Patient ID: Noah Jones, male    DOB: [birth year]  Age: 37 y.o. MRN: [id number]  CC: Annual Exam, Hypertension, and Hyperlipidemia   HPI Noah Jones presents for a CPX and f/up ---    Discussed the use of AI scribe software for clinical note transcription with the patient, who gave verbal consent to proceed.  History of Present Illness   The patient, currently on indapamide and olmesartan for blood pressure control, reports no adverse effects such as dizziness or lightheadedness. He also denies experiencing chest pain or shortness of breath. In addition to these medications, he is on vilazodone and Abilify, with no reported issues.  For allergies, the patient has switched from Allegra to Zyrtec. Despite this, he reports increased congestion, particularly in the throat, leading to coughing. He denies any facial pain or signs of infection, such as fever.  He reports a slight feeling of congestion in the nose but is still able to breathe through it.  In terms of physical health, the patient denies any testicular pain or swelling, aside from a known cyst. His blood pressure is well-controlled, and he reports no pain in the midsection of his body.       Outpatient Medications Prior to Visit  Medication Sig Dispense Refill   indapamide (LOZOL) 1.25 MG tablet TAKE 1 TABLET BY MOUTH DAILY. 90 tablet 0   olmesartan (BENICAR) 20 MG tablet TAKE 1 TABLET BY MOUTH EVERY DAY 90 tablet 0   Vilazodone HCl (VIIBRYD) 10 MG TABS TAKE 1 TABLET BY MOUTH EVERY DAY 90 tablet 1   ARIPiprazole (ABILIFY) 2 MG tablet TAKE 1 TABLET BY MOUTH EVERY DAY 90 tablet 0   FEXOFENADINE HCL PO Take 10 mg by mouth daily.     No facility-administered medications prior to visit.    ROS Review of Systems  Constitutional:  Positive for unexpected weight change (wt gain). Negative for chills, diaphoresis and fatigue.  HENT:  Positive for congestion. Negative for ear discharge, ear pain, nosebleeds,  postnasal drip, rhinorrhea, sinus pressure, sinus pain and sore throat.   Eyes: Negative.   Respiratory:  Negative for chest tightness, shortness of breath and wheezing.   Cardiovascular:  Negative for chest pain, palpitations and leg swelling.  Gastrointestinal:  Negative for abdominal pain, constipation, diarrhea, nausea and vomiting.  Endocrine: Negative.   Genitourinary: Negative.  Negative for difficulty urinating, penile swelling and scrotal swelling.  Musculoskeletal: Negative.  Negative for arthralgias and myalgias.  Skin: Negative.   Neurological: Negative.  Negative for dizziness and weakness.  Hematological:  Negative for adenopathy. Does not bruise/bleed easily.  Psychiatric/Behavioral:  Positive for dysphoric mood. Negative for confusion, decreased concentration and sleep disturbance. The patient is nervous/anxious.     Objective:  BP 132/84 (BP Location: Left Arm, Patient Position: Sitting, Cuff Size: Normal)   Pulse 86   Temp 98.4 F (36.9 C) (Oral)   Resp 16   Ht 5\' 10"  (1.778 m)   Wt 239 lb (108.4 kg)   SpO2 97%   BMI 34.29 kg/m   BP Readings from Last 3 Encounters:  04/23/23 132/84  12/25/22 (!) 150/98  03/19/22 (!) 144/88    Wt Readings from Last 3 Encounters:  04/23/23 239 lb (108.4 kg)  12/25/22 234 lb 12.8 oz (106.5 kg)  03/19/22 222 lb (100.7 kg)    Physical Exam Vitals reviewed.  Constitutional:      Appearance: Normal appearance.  HENT:     Nose: Mucosal edema and congestion present. No rhinorrhea.  Right Nostril: No foreign body or epistaxis.     Left Nostril: No foreign body or epistaxis.     Right Turbinates: Pale.     Left Turbinates: Pale.     Mouth/Throat:     Mouth: Mucous membranes are moist.  Eyes:     General: No scleral icterus.    Conjunctiva/sclera: Conjunctivae normal.  Cardiovascular:     Rate and Rhythm: Normal rate and regular rhythm.     Heart sounds: No murmur heard.    No friction rub. No gallop.  Pulmonary:      Effort: Pulmonary effort is normal.     Breath sounds: No stridor. No wheezing, rhonchi or rales.  Abdominal:     General: Abdomen is flat.     Palpations: There is no mass.     Tenderness: There is no abdominal tenderness. There is no guarding.     Hernia: No hernia is present.  Musculoskeletal:        General: Normal range of motion.     Cervical back: Neck supple.     Right lower leg: No edema.     Left lower leg: No edema.  Lymphadenopathy:     Cervical: No cervical adenopathy.  Skin:    General: Skin is warm and dry.     Findings: No rash.  Neurological:     General: No focal deficit present.     Mental Status: He is alert. Mental status is at baseline.  Psychiatric:        Mood and Affect: Mood normal.        Behavior: Behavior normal.        Thought Content: Thought content normal.        Judgment: Judgment normal.     Lab Results  Component Value Date   WBC 6.0 12/25/2022   HGB 15.4 12/25/2022   HCT 46.4 12/25/2022   PLT 228.0 12/25/2022   GLUCOSE 95 04/23/2023   CHOL 164 03/19/2022   TRIG 177.0 (H) 12/25/2022   HDL 37.50 (L) 03/19/2022   LDLDIRECT 102.0 03/19/2022   LDLCALC 117 (H) 11/27/2017   ALT 69 (H) 12/25/2022   AST 30 12/25/2022   NA 136 04/23/2023   K 3.6 04/23/2023   CL 98 04/23/2023   CREATININE 0.91 04/23/2023   BUN 21 04/23/2023   CO2 29 04/23/2023   TSH 0.63 12/25/2022   HGBA1C 5.2 10/26/2019    MR CERVICAL SPINE W WO CONTRAST  Result Date: 11/11/2019 [facility] NEUROIMAGING REPORT STUDY DATE: 11/09/19 PATIENT NAME: Noah Jones DOB: 03-03-1986 MRN: [id number] ORDERING CLINICIAN: Suanne Marker, MD CLINICAL HISTORY: 37 year old male with numbness. EXAM: MR CERVICAL SPINE W WO CONTRAST TECHNIQUE: MRI of the cervical spine was obtained utilizing 3 mm sagittal slices from the posterior fossa down to the T3-4 level with T1, T2 and inversion recovery views. In addition 4 mm axial slices from C2-3 down to T1-2 level were  included with T2 and gradient echo views. CONTRAST: 17ml multihance COMPARISON: none IMAGING SITE: [facility] [address] (1.5 Tesla MRI)  FINDINGS: On sagittal views the vertebral bodies have normal height and alignment. Minimal disc bulging at C5-6. The spinal cord is normal in size and appearance. The posterior fossa, pituitary gland and paraspinal soft tissues are unremarkable.  On axial views there is no spinal stenosis or foraminal narrowing. Limited views of the soft tissues of the head and neck are unremarkable.   Normal MRI cervical spine (with and  without). INTERPRETING PHYSICIAN: Suanne Marker, MD Certified in Neurology, Neurophysiology and Neuroimaging [facility] [address] [place], [place] [address] [phone]   MR BRAIN W WO CONTRAST  Result Date: 11/11/2019 [facility] NEUROIMAGING REPORT STUDY DATE: 11/09/19 PATIENT NAME: Sinan Shively DOB: [birth year] MRN: [id number] ORDERING CLINICIAN: Suanne Marker, MD CLINICAL HISTORY: 37 year old male with numbness. EXAM: MR BRAIN W WO CONTRAST TECHNIQUE: MRI of the brain with and without contrast was obtained utilizing 5 mm axial slices with T1, T2, T2 flair, SWI and diffusion weighted views.  T1 sagittal, T2 coronal and postcontrast views in the axial and coronal plane were obtained. CONTRAST: 17ml multihance COMPARISON: none IMAGING SITE: [facility] [address] (1.5 Tesla MRI)  FINDINGS: No abnormal lesions are seen on diffusion-weighted views to suggest acute ischemia. The cortical sulci, fissures and cisterns are normal in size and appearance. Lateral, third and fourth ventricle are normal in size and appearance. No extra-axial fluid collections are seen. No evidence of mass effect or midline shift.  No abnormal lesions are seen on post contrast views.  On sagittal views the posterior fossa, pituitary gland and corpus callosum are unremarkable. No  evidence of intracranial hemorrhage on SWI views. The orbits and their contents, paranasal sinuses and calvarium are unremarkable.  Intracranial flow voids are present.   Normal MRI brain (with and without). INTERPRETING PHYSICIAN: Suanne Marker, MD Certified in Neurology, Neurophysiology and Neuroimaging [facility] [address] [place], [place] [address] [phone]    Assessment & Plan:   Seasonal allergic rhinitis due to pollen -     Levocetirizine Dihydrochloride; Take 1 tablet (5 mg total) by mouth every evening.  Dispense: 90 tablet; Refill: 1 -     Montelukast Sodium; Take 1 tablet (10 mg total) by mouth at bedtime.  Dispense: 90 tablet; Refill: 1 -     Fluticasone Propionate; Place 2 sprays into both nostrils daily.  Dispense: 48 g; Refill: 1  Primary hypertension- His BP is well-controlled. -     Basic metabolic panel; Future  Encounter for general adult medical examination with abnormal findings- Exam completed, labs reviewed, vaccines reviewed, no cancer screenings indicated, pt ed material was given.    Current severe episode of major depressive disorder without psychotic features without prior episode [facility])- He is doing well on the current regimen. -     ARIPiprazole; Take 1 tablet (2 mg total) by mouth daily.  Dispense: 90 tablet; Refill: 1     Follow-up: Return in about 6 months (around 10/21/2023).  Sanda Linger, MD

## 2023-04-25 MED ORDER — ARIPIPRAZOLE 2 MG PO TABS: 2.0000 mg | ORAL_TABLET | Freq: Every day | ORAL | 1 refills | Status: DC

## 2023-06-24 ENCOUNTER — Other Ambulatory Visit: Payer: Self-pay | Admitting: Internal Medicine

## 2023-06-24 DIAGNOSIS — I10 Essential (primary) hypertension: Secondary | ICD-10-CM

## 2023-09-26 ENCOUNTER — Other Ambulatory Visit: Payer: Self-pay | Admitting: Internal Medicine

## 2023-09-26 DIAGNOSIS — I10 Essential (primary) hypertension: Secondary | ICD-10-CM

## 2023-10-14 ENCOUNTER — Other Ambulatory Visit: Payer: Self-pay

## 2023-10-19 ENCOUNTER — Other Ambulatory Visit: Payer: Self-pay | Admitting: Internal Medicine

## 2023-10-19 DIAGNOSIS — J301 Allergic rhinitis due to pollen: Secondary | ICD-10-CM

## 2023-10-19 DIAGNOSIS — F322 Major depressive disorder, single episode, severe without psychotic features: Secondary | ICD-10-CM

## 2023-10-21 ENCOUNTER — Other Ambulatory Visit: Payer: Self-pay | Admitting: Internal Medicine

## 2023-10-21 DIAGNOSIS — J301 Allergic rhinitis due to pollen: Secondary | ICD-10-CM

## 2023-11-02 ENCOUNTER — Other Ambulatory Visit: Payer: Self-pay | Admitting: Internal Medicine

## 2023-11-02 DIAGNOSIS — F322 Major depressive disorder, single episode, severe without psychotic features: Secondary | ICD-10-CM

## 2023-11-11 ENCOUNTER — Other Ambulatory Visit: Payer: Self-pay | Admitting: Internal Medicine

## 2023-11-11 DIAGNOSIS — J301 Allergic rhinitis due to pollen: Secondary | ICD-10-CM

## 2023-11-12 NOTE — Telephone Encounter (Signed)
 LVM for patient to return call, overdue for F/U

## 2023-11-14 ENCOUNTER — Other Ambulatory Visit: Payer: Self-pay | Admitting: Internal Medicine

## 2023-11-14 DIAGNOSIS — J301 Allergic rhinitis due to pollen: Secondary | ICD-10-CM

## 2023-11-14 DIAGNOSIS — F322 Major depressive disorder, single episode, severe without psychotic features: Secondary | ICD-10-CM

## 2023-12-04 ENCOUNTER — Ambulatory Visit: Admitting: Internal Medicine

## 2023-12-04 ENCOUNTER — Other Ambulatory Visit

## 2023-12-04 VITALS — BP 132/82 | HR 86 | Temp 98.6°F | Resp 16 | Ht 70.0 in | Wt 235.4 lb

## 2023-12-04 DIAGNOSIS — E781 Pure hyperglyceridemia: Secondary | ICD-10-CM

## 2023-12-04 DIAGNOSIS — F5101 Primary insomnia: Secondary | ICD-10-CM | POA: Diagnosis not present

## 2023-12-04 DIAGNOSIS — I10 Essential (primary) hypertension: Secondary | ICD-10-CM

## 2023-12-04 DIAGNOSIS — E876 Hypokalemia: Secondary | ICD-10-CM

## 2023-12-04 DIAGNOSIS — T502X5A Adverse effect of carbonic-anhydrase inhibitors, benzothiadiazides and other diuretics, initial encounter: Secondary | ICD-10-CM

## 2023-12-04 DIAGNOSIS — F322 Major depressive disorder, single episode, severe without psychotic features: Secondary | ICD-10-CM | POA: Diagnosis not present

## 2023-12-04 LAB — HEPATIC FUNCTION PANEL
ALT: 73 U/L — ABNORMAL HIGH (ref 0–53)
AST: 39 U/L — ABNORMAL HIGH (ref 0–37)
Albumin: 4.8 g/dL (ref 3.5–5.2)
Alkaline Phosphatase: 82 U/L (ref 39–117)
Bilirubin, Direct: 0.2 mg/dL (ref 0.0–0.3)
Total Bilirubin: 0.9 mg/dL (ref 0.2–1.2)
Total Protein: 7.9 g/dL (ref 6.0–8.3)

## 2023-12-04 LAB — BASIC METABOLIC PANEL WITH GFR
BUN: 21 mg/dL (ref 6–23)
CO2: 27 meq/L (ref 19–32)
Calcium: 9.6 mg/dL (ref 8.4–10.5)
Chloride: 101 meq/L (ref 96–112)
Creatinine, Ser: 0.86 mg/dL (ref 0.40–1.50)
GFR: 109.38 mL/min (ref >60.00)
Glucose, Bld: 97 mg/dL (ref 70–99)
Potassium: 3.4 meq/L — ABNORMAL LOW (ref 3.5–5.1)
Sodium: 137 meq/L (ref 135–145)

## 2023-12-04 LAB — CBC WITH DIFFERENTIAL/PLATELET
Basophils Absolute: 0.1 10*3/uL (ref 0.0–0.1)
Basophils Relative: 0.6 % (ref 0.0–3.0)
Eosinophils Absolute: 0.1 10*3/uL (ref 0.0–0.7)
Eosinophils Relative: 0.9 % (ref 0.0–5.0)
HCT: 42.3 % (ref 39.0–52.0)
Hemoglobin: 14.5 g/dL (ref 13.0–17.0)
Lymphocytes Relative: 25.6 % (ref 12.0–46.0)
Lymphs Abs: 2.5 10*3/uL (ref 0.7–4.0)
MCHC: 34.2 g/dL (ref 30.0–36.0)
MCV: 86.4 fl (ref 78.0–100.0)
Monocytes Absolute: 0.7 10*3/uL (ref 0.1–1.0)
Monocytes Relative: 7.5 % (ref 3.0–12.0)
Neutro Abs: 6.3 10*3/uL (ref 1.4–7.7)
Neutrophils Relative %: 65.4 % (ref 43.0–77.0)
Platelets: 222 10*3/uL (ref 150.0–400.0)
RBC: 4.9 Mil/uL (ref 4.22–5.81)
RDW: 13.1 % (ref 11.5–15.5)
WBC: 9.7 10*3/uL (ref 4.0–10.5)

## 2023-12-04 LAB — LIPID PANEL
Cholesterol: 206 mg/dL — ABNORMAL HIGH (ref 0–200)
HDL: 36.4 mg/dL — ABNORMAL LOW (ref >39.00)
LDL Cholesterol: 136 mg/dL — ABNORMAL HIGH (ref 0–99)
NonHDL: 170.02
Total CHOL/HDL Ratio: 6
Triglycerides: 171 mg/dL — ABNORMAL HIGH (ref 0.0–149.0)
VLDL: 34.2 mg/dL (ref 0.0–40.0)

## 2023-12-04 LAB — TSH: TSH: 1.09 u[IU]/mL (ref 0.35–5.50)

## 2023-12-04 MED ORDER — TRAZODONE HCL 50 MG PO TABS: 50.0000 mg | ORAL_TABLET | Freq: Every day | ORAL | 1 refills | Status: AC

## 2023-12-04 NOTE — Patient Instructions (Addendum)
 [address] Lab in basement   Hypertension, Adult High blood pressure (hypertension) is when the force of blood pumping through the arteries is too strong. The arteries are the blood vessels that carry blood from the heart throughout the body. Hypertension forces the heart to work harder to pump blood and may cause arteries to become narrow or stiff. Untreated or uncontrolled hypertension can lead to a heart attack, heart failure, a stroke, kidney disease, and other problems. A blood pressure reading consists of a higher number over a lower number. Ideally, your blood pressure should be below 120/80. The first (top) number is called the systolic pressure. It is a measure of the pressure in your arteries as your heart beats. The second (bottom) number is called the diastolic pressure. It is a measure of the pressure in your arteries as the heart relaxes. What are the causes? The exact cause of this condition is not known. There are some conditions that result in high blood pressure. What increases the risk? Certain factors may make you more likely to develop high blood pressure. Some of these risk factors are under your control, including: Smoking. Not getting enough exercise or physical activity. Being overweight. Having too much fat, sugar, calories, or salt (sodium) in your diet. Drinking too much alcohol. Other risk factors include: Having a personal history of heart disease, diabetes, high cholesterol, or kidney disease. Stress. Having a family history of high blood pressure and high cholesterol. Having obstructive sleep apnea. Age. The risk increases with age. What are the signs or symptoms? High blood pressure may not cause symptoms. Very high blood pressure (hypertensive crisis) may cause: Headache. Fast or irregular heartbeats (palpitations). Shortness of breath. Nosebleed. Nausea and vomiting. Vision changes. Severe chest pain, dizziness, and seizures. How is this  diagnosed? This condition is diagnosed by measuring your blood pressure while you are seated, with your arm resting on a flat surface, your legs uncrossed, and your feet flat on the floor. The cuff of the blood pressure monitor will be placed directly against the skin of your upper arm at the level of your heart. Blood pressure should be measured at least twice using the same arm. Certain conditions can cause a difference in blood pressure between your right and left arms. If you have a high blood pressure reading during one visit or you have normal blood pressure with other risk factors, you may be asked to: Return on a different day to have your blood pressure checked again. Monitor your blood pressure at home for 1 week or longer. If you are diagnosed with hypertension, you may have other blood or imaging tests to help your health care provider understand your overall risk for other conditions. How is this treated? This condition is treated by making healthy lifestyle changes, such as eating healthy foods, exercising more, and reducing your alcohol intake. You may be referred for counseling on a healthy diet and physical activity. Your health care provider may prescribe medicine if lifestyle changes are not enough to get your blood pressure under control and if: Your systolic blood pressure is above 130. Your diastolic blood pressure is above 80. Your personal target blood pressure may vary depending on your medical conditions, your age, and other factors. Follow these instructions at home: Eating and drinking  Eat a diet that is high in fiber and potassium, and low in sodium, added sugar, and fat. An example of this eating plan is called the DASH diet. DASH stands for Dietary  Approaches to Stop Hypertension. To eat this way: Eat plenty of fresh fruits and vegetables. Try to fill one half of your plate at each meal with fruits and vegetables. Eat whole grains, such as whole-wheat pasta, brown  rice, or whole-grain bread. Fill about one fourth of your plate with whole grains. Eat or drink low-fat dairy products, such as skim milk or low-fat yogurt. Avoid fatty cuts of meat, processed or cured meats, and poultry with skin. Fill about one fourth of your plate with lean proteins, such as fish, chicken without skin, beans, eggs, or tofu. Avoid pre-made and processed foods. These tend to be higher in sodium, added sugar, and fat. Reduce your daily sodium intake. Many people with hypertension should eat less than 1,500 mg of sodium a day. Do not drink alcohol if: Your health care provider tells you not to drink. You are pregnant, may be pregnant, or are planning to become pregnant. If you drink alcohol: Limit how much you have to: 0-1 drink a day for women. 0-2 drinks a day for men. Know how much alcohol is in your drink. In the [place] one drink equals one 12 oz bottle of beer (355 mL), one 5 oz glass of wine (148 mL), or one 1 oz glass of hard liquor (44 mL). Lifestyle  Work with your health care provider to maintain a healthy body weight or to lose weight. Ask what an ideal weight is for you. Get at least 30 minutes of exercise that causes your heart to beat faster (aerobic exercise) most days of the week. Activities may include walking, swimming, or biking. Include exercise to strengthen your muscles (resistance exercise), such as Pilates or lifting weights, as part of your weekly exercise routine. Try to do these types of exercises for 30 minutes at least 3 days a week. Do not use any products that contain nicotine or tobacco. These products include cigarettes, chewing tobacco, and vaping devices, such as e-cigarettes. If you need help quitting, ask your health care provider. Monitor your blood pressure at home as told by your health care provider. Keep all follow-up visits. This is important. Medicines Take over-the-counter and prescription medicines only as told by your health care  provider. Follow directions carefully. Blood pressure medicines must be taken as prescribed. Do not skip doses of blood pressure medicine. Doing this puts you at risk for problems and can make the medicine less effective. Ask your health care provider about side effects or reactions to medicines that you should watch for. Contact a health care provider if you: Think you are having a reaction to a medicine you are taking. Have headaches that keep coming back (recurring). Feel dizzy. Have swelling in your ankles. Have trouble with your vision. Get help right away if you: Develop a severe headache or confusion. Have unusual weakness or numbness. Feel faint. Have severe pain in your chest or abdomen. Vomit repeatedly. Have trouble breathing. These symptoms may be an emergency. Get help right away. Call 911. Do not wait to see if the symptoms will go away. Do not drive yourself to the hospital. Summary Hypertension is when the force of blood pumping through your arteries is too strong. If this condition is not controlled, it may put you at risk for serious complications. Your personal target blood pressure may vary depending on your medical conditions, your age, and other factors. For most people, a normal blood pressure is less than 120/80. Hypertension is treated with lifestyle changes, medicines, or a combination of  both. Lifestyle changes include losing weight, eating a healthy, low-sodium diet, exercising more, and limiting alcohol. This information is not intended to replace advice given to you by your health care provider. Make sure you discuss any questions you have with your health care provider. Document Revised: 03/27/2021 Document Reviewed: 03/27/2021 Elsevier Patient Education  2024 [employer].

## 2023-12-04 NOTE — Progress Notes (Addendum)
 Subjective:  Patient ID: Noah Jones, male    DOB: 04-30-1986  Age: 38 y.o. MRN: [id number]  CC: Hypertension and Depression   HPI Noah Jones presents for f/up ----  Discussed the use of AI scribe software for clinical note transcription with the patient, who gave verbal consent to proceed.  History of Present Illness   Noah Jones is a 38 year old male with anxiety and asthma who presents with concerns about sleeplessness and occasional cough.  He has been experiencing sleeplessness over the past month, characterized by falling asleep for one to two hours and then waking up, with frequent awakenings throughout the night. He wakes up before his alarm clock and feels tired the next day, although he feels fine once he starts engaging in activities. He has tried sleep aids in the past without success and is unsure if his current sleep issues are a side effect of his medication. He is currently taking Abilify  and Viibryd  for anxiety, which is under good control.   No symptoms of high blood pressure such as headache, blurred vision, chest pain, or shortness of breath. His mood is stable, and he recently received recognition as employee of the month, which has positively impacted his mood.       Outpatient Medications Prior to Visit  Medication Sig Dispense Refill   ARIPiprazole  (ABILIFY ) 2 MG tablet TAKE 1 TABLET (2 MG TOTAL) BY MOUTH DAILY. PLEASE SCHEDULE APPOINTMENT FOR REFILLS. 90 tablet 1   fluticasone  (FLONASE ) 50 MCG/ACT nasal spray Place 2 sprays into both nostrils daily. 48 g 1   levocetirizine (XYZAL ) 5 MG tablet TAKE 1 TABLET BY MOUTH EVERY DAY IN THE EVENING 90 tablet 1   montelukast  (SINGULAIR ) 10 MG tablet TAKE 1 TABLET BY MOUTH EVERYDAY AT BEDTIME. PLEASE SCHEDULE APPOINTMENT FOR REFILLS 90 tablet 1   Vilazodone  HCl (VIIBRYD ) 10 MG TABS TAKE 1 TABLET BY MOUTH EVERY DAY 90 tablet 1   indapamide  (LOZOL ) 1.25 MG tablet TAKE 1 TABLET BY MOUTH DAILY. 90 tablet 0    olmesartan  (BENICAR ) 20 MG tablet TAKE 1 TABLET BY MOUTH EVERY DAY 90 tablet 0   No facility-administered medications prior to visit.    ROS Review of Systems  Constitutional: Negative.  Negative for chills, diaphoresis and fatigue.  HENT:  Positive for congestion and postnasal drip. Negative for sore throat.   Eyes: Negative.   Respiratory: Negative.  Negative for cough, chest tightness, shortness of breath and wheezing.   Cardiovascular:  Negative for chest pain, palpitations and leg swelling.  Gastrointestinal:  Negative for abdominal pain, constipation, diarrhea, nausea and vomiting.  Endocrine: Negative.   Genitourinary: Negative.  Negative for difficulty urinating.  Musculoskeletal: Negative.   Skin: Negative.   Neurological: Negative.  Negative for dizziness and weakness.  Hematological:  Negative for adenopathy. Does not bruise/bleed easily.  Psychiatric/Behavioral:  Positive for dysphoric mood and sleep disturbance. Negative for confusion, decreased concentration and suicidal ideas. The patient is nervous/anxious.     Objective:  BP 132/82 (BP Location: Left Arm, Patient Position: Sitting, Cuff Size: Normal)   Pulse 86   Temp 98.6 F (37 C) (Temporal)   Resp 16   Ht 5' 10 (1.778 m)   Wt 235 lb 6.4 oz (106.8 kg)   SpO2 96%   BMI 33.78 kg/m   BP Readings from Last 3 Encounters:  12/04/23 132/82  04/23/23 132/84  12/25/22 (!) 150/98    Wt Readings from Last 3 Encounters:  12/04/23 235 lb 6.4 oz (106.8  kg)  04/23/23 239 lb (108.4 kg)  12/25/22 234 lb 12.8 oz (106.5 kg)    Physical Exam Vitals reviewed.  Constitutional:      Appearance: Normal appearance.  HENT:     Mouth/Throat:     Mouth: Mucous membranes are moist.  Eyes:     General: No scleral icterus.    Conjunctiva/sclera: Conjunctivae normal.  Cardiovascular:     Rate and Rhythm: Normal rate and regular rhythm.     Heart sounds: No murmur heard.    No friction rub. No gallop.  Pulmonary:      Effort: Pulmonary effort is normal.     Breath sounds: No stridor. No wheezing, rhonchi or rales.  Abdominal:     General: Abdomen is flat.     Palpations: There is no mass.     Tenderness: There is no abdominal tenderness. There is no guarding.     Hernia: No hernia is present.  Musculoskeletal:        General: Normal range of motion.     Cervical back: Neck supple.     Right lower leg: No edema.     Left lower leg: No edema.  Lymphadenopathy:     Cervical: No cervical adenopathy.  Skin:    General: Skin is warm and dry.  Neurological:     General: No focal deficit present.     Mental Status: He is alert. Mental status is at baseline.  Psychiatric:        Mood and Affect: Mood normal.        Behavior: Behavior normal.     Lab Results  Component Value Date   WBC 9.7 12/04/2023   HGB 14.5 12/04/2023   HCT 42.3 12/04/2023   PLT 222.0 12/04/2023   GLUCOSE 97 12/04/2023   CHOL 206 (H) 12/04/2023   TRIG 171.0 (H) 12/04/2023   HDL 36.40 (L) 12/04/2023   LDLDIRECT 102.0 03/19/2022   LDLCALC 136 (H) 12/04/2023   ALT 73 (H) 12/04/2023   AST 39 (H) 12/04/2023   NA 137 12/04/2023   K 3.4 (L) 12/04/2023   CL 101 12/04/2023   CREATININE 0.86 12/04/2023   BUN 21 12/04/2023   CO2 27 12/04/2023   TSH 1.09 12/04/2023   HGBA1C 5.2 10/26/2019    MR CERVICAL SPINE W WO CONTRAST Result Date: 11/11/2019 [facility] NEUROIMAGING REPORT STUDY DATE: 11/09/19 PATIENT NAME: Noah Jones DOB: 06/30/1985 MRN: [id number] ORDERING CLINICIAN: Margaret Eduard SAUNDERS, MD CLINICAL HISTORY: 38 year old male with numbness. EXAM: MR CERVICAL SPINE W WO CONTRAST TECHNIQUE: MRI of the cervical spine was obtained utilizing 3 mm sagittal slices from the posterior fossa down to the T3-4 level with T1, T2 and inversion recovery views. In addition 4 mm axial slices from C2-3 down to T1-2 level were included with T2 and gradient echo views. CONTRAST: 17ml multihance  COMPARISON: none IMAGING SITE:  [facility] [address] (1.5 Tesla MRI)  FINDINGS: On sagittal views the vertebral bodies have normal height and alignment. Minimal disc bulging at C5-6. The spinal cord is normal in size and appearance. The posterior fossa, pituitary gland and paraspinal soft tissues are unremarkable.  On axial views there is no spinal stenosis or foraminal narrowing. Limited views of the soft tissues of the head and neck are unremarkable.   Normal MRI cervical spine (with and without). INTERPRETING PHYSICIAN: EDUARD FABIENE MARGARET, MD Certified in Neurology, Neurophysiology and Neuroimaging [facility] [address], [address] [place], [place] [address] [phone]  [phone]   MR BRAIN W WO CONTRAST Result Date: 11/11/2019 [facility] NEUROIMAGING REPORT STUDY DATE: 11/09/19 PATIENT NAME: Noah Jones DOB: 12-08-1985 MRN: [id number] ORDERING CLINICIAN: Margaret Eduard SAUNDERS, MD CLINICAL HISTORY: 38 year old male with numbness. EXAM: MR BRAIN W WO CONTRAST TECHNIQUE: MRI of the brain with and without contrast was obtained utilizing 5 mm axial slices with T1, T2, T2 flair, SWI and diffusion weighted views.  T1 sagittal, T2 coronal and postcontrast views in the axial and coronal plane were obtained. CONTRAST: 17ml multihance  COMPARISON: none IMAGING SITE: [facility] [address] (1.5 Tesla MRI)  FINDINGS: No abnormal lesions are seen on diffusion-weighted views to suggest acute ischemia. The cortical sulci, fissures and cisterns are normal in size and appearance. Lateral, third and fourth ventricle are normal in size and appearance. No extra-axial fluid collections are seen. No evidence of mass effect or midline shift.  No abnormal lesions are seen on post contrast views.  On sagittal views the posterior fossa, pituitary gland and corpus callosum are unremarkable. No evidence of intracranial hemorrhage on SWI views. The orbits and their contents, paranasal sinuses and  calvarium are unremarkable.  Intracranial flow voids are present.   Normal MRI brain (with and without). INTERPRETING PHYSICIAN: EDUARD FABIENE MARGARET, MD Certified in Neurology, Neurophysiology and Neuroimaging [facility] [address], [address] [place], [place] [address] [phone]     Fibrosis 4 Score = .8 (Low risk)        Interpretation for patients with NAFLD          <1.30       -  F0-F1 (Low risk)          1.30-2.67 -  Indeterminate           >2.67      -  F3-F4 (High risk)     Validated for ages 64-65         Assessment & Plan:  Persistent insomnia of non-organic origin -     traZODone  HCl; Take 1 tablet (50 mg total) by mouth at bedtime.  Dispense: 90 tablet; Refill: 1 -     TSH; Future  Primary hypertension- BP is well controlled. I will treat the hypokalemia. -     Basic metabolic panel with GFR; Future -     CBC with Differential/Platelet; Future -     TSH; Future -     Hepatic function panel; Future -     Indapamide ; Take 1 tablet (1.25 mg total) by mouth daily.  Dispense: 90 tablet; Refill: 1 -     Olmesartan  Medoxomil; Take 1 tablet (20 mg total) by mouth daily.  Dispense: 90 tablet; Refill: 1 -     Potassium Chloride  ER; Take 1 tablet (10 mEq total) by mouth 2 (two) times daily.  Dispense: 180 tablet; Refill: 1  Current severe episode of major depressive disorder without psychotic features without prior episode (HCC) -     traZODone  HCl; Take 1 tablet (50 mg total) by mouth at bedtime.  Dispense: 90 tablet; Refill: 1 -     TSH; Future -     Hepatic function panel; Future  Pure hypertriglyceridemia -     Lipid panel; Future -     Hepatic function panel; Future  Diuretic-induced hypokalemia -     Potassium Chloride  ER; Take 1 tablet (10 mEq total) by mouth 2 (two) times daily.  Dispense: 180 tablet; Refill: 1     Follow-up: Return in about  6 months (around 06/05/2024).  Debby Molt, MD

## 2023-12-05 ENCOUNTER — Ambulatory Visit: Payer: Self-pay | Admitting: Internal Medicine

## 2023-12-05 DIAGNOSIS — E876 Hypokalemia: Secondary | ICD-10-CM | POA: Insufficient documentation

## 2023-12-05 MED ORDER — INDAPAMIDE 1.25 MG PO TABS: 1.2500 mg | ORAL_TABLET | Freq: Every day | ORAL | 1 refills | Status: DC

## 2023-12-05 MED ORDER — OLMESARTAN MEDOXOMIL 20 MG PO TABS: 20.0000 mg | ORAL_TABLET | Freq: Every day | ORAL | 1 refills | Status: DC

## 2023-12-05 MED ORDER — POTASSIUM CHLORIDE ER 10 MEQ PO TBCR: 10.0000 meq | EXTENDED_RELEASE_TABLET | Freq: Two times a day (BID) | ORAL | 1 refills | Status: AC

## 2024-02-17 ENCOUNTER — Other Ambulatory Visit: Payer: Self-pay | Admitting: Internal Medicine

## 2024-02-17 DIAGNOSIS — J301 Allergic rhinitis due to pollen: Secondary | ICD-10-CM

## 2024-05-06 ENCOUNTER — Other Ambulatory Visit: Payer: Self-pay | Admitting: Internal Medicine

## 2024-05-06 DIAGNOSIS — F322 Major depressive disorder, single episode, severe without psychotic features: Secondary | ICD-10-CM

## 2024-05-18 ENCOUNTER — Other Ambulatory Visit: Payer: Self-pay | Admitting: Internal Medicine

## 2024-05-18 DIAGNOSIS — E876 Hypokalemia: Secondary | ICD-10-CM

## 2024-05-18 DIAGNOSIS — I10 Essential (primary) hypertension: Secondary | ICD-10-CM

## 2024-05-18 DIAGNOSIS — F5101 Primary insomnia: Secondary | ICD-10-CM

## 2024-05-18 DIAGNOSIS — F322 Major depressive disorder, single episode, severe without psychotic features: Secondary | ICD-10-CM

## 2024-05-18 DIAGNOSIS — J301 Allergic rhinitis due to pollen: Secondary | ICD-10-CM

## 2024-07-03 ENCOUNTER — Other Ambulatory Visit: Payer: Self-pay | Admitting: Internal Medicine

## 2024-07-03 DIAGNOSIS — I10 Essential (primary) hypertension: Secondary | ICD-10-CM
